# Patient Record
Sex: Female | Born: 1985
Health system: Southern US, Community
[De-identification: ages and names within clinical notes are randomized; demographics above are authoritative.]

## PROBLEM LIST (undated history)

## (undated) DIAGNOSIS — F329 Major depressive disorder, single episode, unspecified: Secondary | ICD-10-CM

## (undated) DIAGNOSIS — F32A Depression, unspecified: Secondary | ICD-10-CM

## (undated) DIAGNOSIS — J45909 Unspecified asthma, uncomplicated: Secondary | ICD-10-CM

## (undated) DIAGNOSIS — I10 Essential (primary) hypertension: Secondary | ICD-10-CM

## (undated) DIAGNOSIS — Z87828 Personal history of other (healed) physical injury and trauma: Secondary | ICD-10-CM

## (undated) DIAGNOSIS — Z8781 Personal history of (healed) traumatic fracture: Secondary | ICD-10-CM

## (undated) DIAGNOSIS — F259 Schizoaffective disorder, unspecified: Secondary | ICD-10-CM

## (undated) DIAGNOSIS — F419 Anxiety disorder, unspecified: Secondary | ICD-10-CM

## (undated) HISTORY — PX: SALPINGECTOMY: SHX328

## (undated) HISTORY — DX: Unspecified asthma, uncomplicated: J45.909

## (undated) HISTORY — DX: Anxiety disorder, unspecified: F41.9

## (undated) HISTORY — PX: WISDOM TOOTH EXTRACTION: SHX21

## (undated) HISTORY — PX: ADENOIDECTOMY: SUR15

---

## 2006-10-21 ENCOUNTER — Ambulatory Visit (HOSPITAL_COMMUNITY): Payer: Self-pay | Admitting: Psychiatry

## 2006-10-21 ENCOUNTER — Ambulatory Visit: Payer: Self-pay | Admitting: Family Medicine

## 2012-04-09 DIAGNOSIS — M545 Low back pain, unspecified: Secondary | ICD-10-CM | POA: Insufficient documentation

## 2014-02-06 ENCOUNTER — Encounter: Payer: Self-pay | Admitting: Emergency Medicine

## 2014-02-06 ENCOUNTER — Emergency Department (INDEPENDENT_AMBULATORY_CARE_PROVIDER_SITE_OTHER)
Admission: EM | Admit: 2014-02-06 | Discharge: 2014-02-06 | Disposition: A | Discharge status: Home / Self Care | Payer: Medicare Other | Source: Home / Self Care | Attending: Family Medicine | Admitting: Family Medicine

## 2014-02-06 DIAGNOSIS — J069 Acute upper respiratory infection, unspecified: Secondary | ICD-10-CM

## 2014-02-06 DIAGNOSIS — Z8781 Personal history of (healed) traumatic fracture: Secondary | ICD-10-CM | POA: Insufficient documentation

## 2014-02-06 DIAGNOSIS — F259 Schizoaffective disorder, unspecified: Secondary | ICD-10-CM | POA: Insufficient documentation

## 2014-02-06 DIAGNOSIS — I10 Essential (primary) hypertension: Secondary | ICD-10-CM | POA: Insufficient documentation

## 2014-02-06 DIAGNOSIS — Z87828 Personal history of other (healed) physical injury and trauma: Secondary | ICD-10-CM | POA: Insufficient documentation

## 2014-02-06 DIAGNOSIS — F329 Major depressive disorder, single episode, unspecified: Secondary | ICD-10-CM | POA: Insufficient documentation

## 2014-02-06 DIAGNOSIS — F32A Depression, unspecified: Secondary | ICD-10-CM | POA: Insufficient documentation

## 2014-02-06 HISTORY — DX: Personal history of (healed) traumatic fracture: Z87.81

## 2014-02-06 HISTORY — DX: Essential (primary) hypertension: I10

## 2014-02-06 HISTORY — DX: Major depressive disorder, single episode, unspecified: F32.9

## 2014-02-06 HISTORY — DX: Schizoaffective disorder, unspecified: F25.9

## 2014-02-06 HISTORY — DX: Personal history of other (healed) physical injury and trauma: Z87.828

## 2014-02-06 HISTORY — DX: Depression, unspecified: F32.A

## 2014-02-06 MED ORDER — SULFAMETHOXAZOLE-TMP DS 800-160 MG PO TABS: 1.0000 | ORAL_TABLET | Freq: Two times a day (BID) | ORAL | Status: DC

## 2014-02-06 MED ORDER — PREDNISONE 20 MG PO TABS: 20.0000 mg | ORAL_TABLET | Freq: Two times a day (BID) | ORAL | Status: DC

## 2014-02-06 MED ORDER — BENZONATATE 200 MG PO CAPS: 200.0000 mg | ORAL_CAPSULE | Freq: Every day | ORAL | Status: DC

## 2014-02-06 MED ORDER — ALBUTEROL SULFATE HFA 108 (90 BASE) MCG/ACT IN AERS: 2.0000 | INHALATION_SPRAY | Freq: Four times a day (QID) | RESPIRATORY_TRACT | Status: DC | PRN

## 2014-02-06 NOTE — Discharge Instructions (Signed)
Take plain Mucinex (1200 mg guaifenesin) twice daily for cough and congestion.  Increase fluid intake, rest. Try warm salt water gargles for sore throat.  Stop all antihistamines for now, and other non-prescription cough/cold preparations. Use Albuterol as needed Begin trimethoprim/sulfamethoxazole if not improving about 5 days or if persistent fever develops (Given a prescription to hold, with an expiration date)  Follow-up with family doctor if not improving 7 to 10 days.

## 2014-02-06 NOTE — ED Provider Notes (Signed)
CSN: [id number]     Arrival date & time 02/06/14  1503 History   First MD Initiated Contact with Patient 02/06/14 1537     Chief Complaint  Patient presents with  . Otalgia    x 2 days  . Sore Throat    x 1 week  . Headache    x 2 days      HPI Comments: Patient complains of sinus drainage for two weeks, but has not felt ill.  Two days ago she developed a mild sore throat, fatigue, earache, headache, and occasional cough. She has a history of mild asthma, and sinusitis.  The history is provided by the patient.    Past Medical History  Diagnosis Date  . Hypertension   . History of fracture of toe   . History of sprained ankle   . Depression   . Schizoaffective disorder    Past Surgical History  Procedure Laterality Date  . Adenoidectomy     Family History  Problem Relation Age of Onset  . Cancer Mother     Breast  . Depression Mother   . Anxiety disorder Mother   . Seizures Mother    History  Substance Use Topics  . Smoking status: Never Smoker   . Smokeless tobacco: Never Used  . Alcohol Use: No   OB History   Grav Para Term Preterm Abortions TAB SAB Ect Mult Living                 Review of Systems + sore throat + cough, minimal No pleuritic pain No wheezing + nasal congestion + post-nasal drainage No sinus pain/pressure No itchy/red eyes + earache No hemoptysis No SOB No fever/chills No nausea No vomiting No abdominal pain No diarrhea No urinary symptoms No skin rash + fatigue No myalgias + headache Used OTC meds without relief  Allergies  Codeine; Doxycycline; Latuda; Penicillins; Resperal-dm; and Topamax  Home Medications   Current Outpatient Rx  Name  Route  Sig  Dispense  Refill  . benztropine (COGENTIN) 1 MG tablet   Oral   Take 2 mg by mouth 2 (two) times daily.         . clonazePAM (KLONOPIN) 0.5 MG tablet   Oral   Take 0.25 mg by mouth daily.         Marland Kitchen. ibuprofen (ADVIL,MOTRIN) 800 MG tablet   Oral   Take 800 mg by  mouth every 8 (eight) hours as needed.         . medroxyPROGESTERone (PROVERA) 5 MG tablet   Oral   Take 5 mg by mouth daily.         . montelukast (SINGULAIR) 10 MG tablet   Oral   Take 10 mg by mouth at bedtime.         Marland Kitchen. omeprazole (PRILOSEC) 40 MG capsule   Oral   Take 40 mg by mouth daily.         . Oxcarbazepine (TRILEPTAL) 300 MG tablet   Oral   Take 600 mg by mouth 2 (two) times daily.         . propranolol (INDERAL) 10 MG tablet   Oral   Take 5 mg by mouth daily.         . sertraline (ZOLOFT) 100 MG tablet   Oral   Take 100 mg by mouth daily.         Marland Kitchen. albuterol (PROVENTIL HFA;VENTOLIN HFA) 108 (90 BASE) MCG/ACT inhaler   Inhalation  Inhale 2 puffs into the lungs every 6 (six) hours as needed for wheezing or shortness of breath.   1 Inhaler   0   . benzonatate (TESSALON) 200 MG capsule   Oral   Take 1 capsule (200 mg total) by mouth at bedtime. Take as needed for cough   12 capsule   0   . predniSONE (DELTASONE) 20 MG tablet   Oral   Take 1 tablet (20 mg total) by mouth 2 (two) times daily. Take with food.   10 tablet   0   . sulfamethoxazole-trimethoprim (BACTRIM DS) 800-160 MG per tablet   Oral   Take 1 tablet by mouth 2 (two) times daily. (Rx void after 02/14/14)   14 tablet   0    BP 143/93  Pulse 92  Temp(Src) 98.2 F (36.8 C) (Oral)  Ht 5\' 6"  (1.676 m)  Wt 313 lb (141.976 kg)  BMI 50.54 kg/m2  SpO2 96%  LMP 01/09/2014 Physical Exam Nursing notes and Vital Signs reviewed. Appearance:  Patient appears stated age, and in no acute distress.  Patient is obese (BMI 50.5) Eyes:  Pupils are equal, round, and reactive to light and accomodation.  Extraocular movement is intact.  Conjunctivae are not inflamed  Ears:  Canals normal.  Tympanic membranes normal.  Nose:  Mildly congested turbinates.  No sinus tenderness.   Pharynx:  Normal Neck:  Supple.  Tender enlarged posterior nodes are palpated bilaterally  Lungs:  Clear to  auscultation.  Breath sounds are equal.  Chest:  Distinct tenderness to palpation over the mid-sternum.  Heart:  Regular rate and rhythm without murmurs, rubs, or gallops.  Abdomen:  Nontender without masses or hepatosplenomegaly.  Bowel sounds are present.  No CVA or flank tenderness.  Extremities:  No edema.  No calf tenderness Skin:  No rash present.   ED Course  Procedures  none      MDM   1. Acute upper respiratory infections of unspecified site; suspect viral URI    There is no evidence of bacterial infection today.  Treat symptomatically for now  Begin prednisone burst.  Prescription written for Benzonatate (Tessalon) to take at bedtime for night-time cough.  Take plain Mucinex (1200 mg guaifenesin) twice daily for cough and congestion.  Increase fluid intake, rest. Try warm salt water gargles for sore throat.  Stop all antihistamines for now, and other non-prescription cough/cold preparations. Use Albuterol as needed Begin trimethoprim/sulfamethoxazole if not improving about 5 days or if persistent fever develops (Given a prescription to hold, with an expiration date)  Follow-up with family doctor if not improving 7 to 10 days.     Lattie Haw, MD 02/07/14 [phone]

## 2014-02-06 NOTE — ED Notes (Signed)
Montasia complains of sore throat for 1 week. She also reports dizziness, eye pain, ear pain, headaches, runny nose, sneezing and hoarseness for 2 days. Denies fever, chills or sweats.

## 2014-02-09 ENCOUNTER — Telehealth: Payer: Self-pay | Admitting: Emergency Medicine

## 2015-03-15 DIAGNOSIS — G4710 Hypersomnia, unspecified: Secondary | ICD-10-CM | POA: Insufficient documentation

## 2018-09-13 LAB — HM PAP SMEAR: HM Pap smear: NEGATIVE

## 2019-07-06 DIAGNOSIS — J45909 Unspecified asthma, uncomplicated: Secondary | ICD-10-CM | POA: Insufficient documentation

## 2019-07-06 DIAGNOSIS — M1990 Unspecified osteoarthritis, unspecified site: Secondary | ICD-10-CM | POA: Insufficient documentation

## 2019-10-17 ENCOUNTER — Other Ambulatory Visit: Payer: Self-pay

## 2019-10-17 ENCOUNTER — Ambulatory Visit (INDEPENDENT_AMBULATORY_CARE_PROVIDER_SITE_OTHER): Payer: Medicaid Other | Admitting: Licensed Clinical Social Worker

## 2019-10-17 DIAGNOSIS — F331 Major depressive disorder, recurrent, moderate: Secondary | ICD-10-CM
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
## 2019-10-31 ENCOUNTER — Encounter (HOSPITAL_COMMUNITY): Payer: Self-pay | Admitting: Psychiatry

## 2019-10-31 ENCOUNTER — Ambulatory Visit (INDEPENDENT_AMBULATORY_CARE_PROVIDER_SITE_OTHER): Payer: Medicaid Other | Admitting: Psychiatry

## 2019-10-31 DIAGNOSIS — F5102 Adjustment insomnia: Secondary | ICD-10-CM | POA: Diagnosis not present

## 2019-10-31 DIAGNOSIS — F251 Schizoaffective disorder, depressive type: Secondary | ICD-10-CM | POA: Diagnosis not present

## 2019-10-31 DIAGNOSIS — G4700 Insomnia, unspecified: Secondary | ICD-10-CM | POA: Insufficient documentation

## 2019-10-31 DIAGNOSIS — F411 Generalized anxiety disorder: Secondary | ICD-10-CM | POA: Diagnosis not present

## 2019-10-31 DIAGNOSIS — J309 Allergic rhinitis, unspecified: Secondary | ICD-10-CM | POA: Insufficient documentation

## 2019-10-31 MED ORDER — PERPHENAZINE 4 MG PO TABS: 4.0000 mg | ORAL_TABLET | Freq: Two times a day (BID) | ORAL | 1 refills | Status: DC

## 2019-10-31 MED ORDER — VENLAFAXINE HCL ER 37.5 MG PO CP24: 112.5000 mg | ORAL_CAPSULE | Freq: Every day | ORAL | 1 refills | Status: DC

## 2019-10-31 MED ORDER — BENZTROPINE MESYLATE 0.5 MG PO TABS: 0.5000 mg | ORAL_TABLET | Freq: Two times a day (BID) | ORAL | 1 refills | Status: DC

## 2019-10-31 MED ORDER — OXCARBAZEPINE 300 MG PO TABS: 600.0000 mg | ORAL_TABLET | Freq: Two times a day (BID) | ORAL | 1 refills | Status: DC
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
## 2019-11-23 ENCOUNTER — Ambulatory Visit (INDEPENDENT_AMBULATORY_CARE_PROVIDER_SITE_OTHER): Payer: Medicare Other | Admitting: Licensed Clinical Social Worker

## 2019-11-23 DIAGNOSIS — F251 Schizoaffective disorder, depressive type: Secondary | ICD-10-CM

## 2019-11-23 DIAGNOSIS — F411 Generalized anxiety disorder: Secondary | ICD-10-CM | POA: Diagnosis not present

## 2019-11-23 DIAGNOSIS — F5102 Adjustment insomnia: Secondary | ICD-10-CM

## 2019-11-23 NOTE — Progress Notes (Signed)
Virtual Visit via Video Note  I connected with Shannon Boyd on 11/23/19 at 10:00 AM EST by a video enabled telemedicine application and verified that I am speaking with the correct person using two identifiers.   I discussed the limitations of evaluation and management by telemedicine and the availability of in person appointments. The patient expressed understanding and agreed to proceed.  I discussed the assessment and treatment plan with the patient. The patient was provided an opportunity to ask questions and all were answered. The patient agreed with the plan and demonstrated an understanding of the instructions.   The patient was advised to call back or seek an in-person evaluation if the symptoms worsen or if the condition fails to improve as anticipated.  I provided 55 minutes of non-face-to-face time during this encounter.  THERAPIST PROGRESS NOTE  Session Time: 11:02 PM to 11:57 PM  Participation Level: Active  Behavioral Response: CasualAlertDepressed-appropriate in session  Type of Therapy: Individual Therapy  Treatment Goals addressed: maintain stability with mood, learn and apply effective strategies to manage mood, coping  Interventions: CBT, Solution Focused, Strength-based, Supportive, Reframing and Other: self-esteem, coping  Summary: Shannon Boyd is a 33 y.o. female who presents with symptoms of depression and sadness and "I have gotten through it so far" Trying to find someone to have a relationship and haven't found anybody yet. Describes that she feels lonely, has been trying online dating  Did before gave up and tried more recently. Discussed the struggles of online dating with patient. Therapist reviewed with patient realities of online dating but positive feedback for her taking action steps and putting in effort to meet somebody. Reviewed other options where she could meet somebody such as engaging in activities she enjoys. Patient shares there is a singles group  for church.  Now going to [employer] and [employer]. Thinks it should be over soon  Discussed feeling lonely attends church things and being with mom. Those things are not working and feels more isolated. Reviewed work situation and how that can help with feeling isolated and lonely.  Has disability but trying to work. 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 Stopped working at [employer]. Upset with administration because didn't hire anyone to help and felt overwhelmed. Open to another job. Looking at different jobs looking at part-time because helps take care of mom. Mom has neurological issues so not easily explain her medical issues. Reviewed conflict with mom and patient shares mostly getting stuff done and who is going to do it. Symptoms experiencing is depression but is working through with coping skills taught. Reviewed her coping skills. Reviewed challenging negative thoughts. Patient explains one a thought she has is that she is a failure. Relates even though has that thought she realizes she graduated from college and accomplishing things but "right now don't feel like I have anything to show for it". Reviewed helps to challenge negative thought with tattoo with semi colon which form the body and head of butterfly and wings come out from it. Relates it reminds her of Darrick Meigs message that she was saved and that she is worth saving, helps to remind her and helps a lot. Physical reminders help. Has bracelets such as "this shall pass" and this patient knows to be the truth. A semi-colon is a symbol of survived suicide and applicable to patient. Reviewed in general that spirituality helps with coping. Reviewed strategies for self-esteem engaging in activities  and having a sense of being able to do something, leads to feeling of accomplishment. Reviewed session and patient relates it is a reinforcer of doing the right thing to  reach out, be cautious. Struggle with self-esteem but still have affirmations when start feeling that way Suicidal/Homicidal: No  Therapist Response: Reviewed symptoms, discussed stressors, facilitated expression of thoughts and feelings.  Completed treatment plan and patient gave verbal consent to complete virtually.  Processed patient's feelings to help cope with starting to date online, offering more realistic expectations as well as needing patient's and positive coping that she is taking a step to meet people because she feels lonely.  Reviewed other ways to meet people.  Reviewed coping strategies in general she uses for depression.  Talked about automatic thoughts that lead to depression that are distorted and helps to challenge for more accurate perspective.  Identified patient's thought that "I am a failure" worked on ways to challenge that thought pointing out how she is shared with therapist ability to cope expressing capability and competence at this believe will also help her cope with stress more effectively.  Reviewed accomplishment of graduating from college, provided education that self-esteem involves recognizing that we will have conditional value and we could do things that help [place] appreciate her value.  Encourage patient engaging in activity, some type of hobby, self-esteem comes with being able to do something that leads to feelings of accomplishment.  Reviewed more realistic expectation for career that for some people takes time and also other strategy of positive affirmations helps and changing neuro pathways to help 1 thing more positively.  Will explore in more depth this topic as well as working on strategies to build self-esteem in general and manage mood.  Provided strength based and supportive intervention.Shannon Boyd**  Plan: Return again in 3-4 weeks.2.Work with patient on management of stressors and strategies to manage mood.3.Work with patient on self-esteem strategies such as  Secretary/administrator.  Diagnosis: Axis I:  schizoaffective disorder, depressive type, generalized anxiety disorder, adjustment insomnia    Axis II: No diagnosis    Coolidge Breeze, LCSW 11/23/2019

## 2019-12-22 ENCOUNTER — Ambulatory Visit (INDEPENDENT_AMBULATORY_CARE_PROVIDER_SITE_OTHER): Payer: Medicare Other | Admitting: Licensed Clinical Social Worker

## 2019-12-22 ENCOUNTER — Other Ambulatory Visit: Payer: Self-pay

## 2019-12-22 DIAGNOSIS — F251 Schizoaffective disorder, depressive type: Secondary | ICD-10-CM | POA: Diagnosis not present

## 2019-12-22 DIAGNOSIS — F5102 Adjustment insomnia: Secondary | ICD-10-CM | POA: Diagnosis not present

## 2019-12-22 DIAGNOSIS — F411 Generalized anxiety disorder: Secondary | ICD-10-CM

## 2019-12-22 NOTE — Progress Notes (Signed)
Virtual Visit via Video Note  I connected with Shannon Boyd on 12/22/19 at 10:00 AM EST by a video enabled telemedicine application and verified that I am speaking with the correct person using two identifiers.   I discussed the limitations of evaluation and management by telemedicine and the availability of in person appointments. The patient expressed understanding and agreed to proceed.    I discussed the assessment and treatment plan with the patient. The patient was provided an opportunity to ask questions and all were answered. The patient agreed with the plan and demonstrated an understanding of the instructions.   The patient was advised to call back or seek an in-person evaluation if the symptoms worsen or if the condition fails to improve as anticipated.  I provided 53 minutes of non-face-to-face time during this encounter.  THERAPIST PROGRESS NOTE  Session Time: 10:03 AM to 10:56 AM  Participation Level: Active  Behavioral Response: CasualAlertDysphoric and appropriate in session  Type of Therapy: Individual Therapy  Treatment Goals addressed:  maintain stability with mood, learn and apply effective strategies to manage mood, coping  Interventions: CBT, Solution Focused, Strength-based, Supportive and Other: coping  Summary: Shannon Boyd is a 34 y.o. female who presents with alright. At night get sad occassionally 1-2 a week and prays until get to sleep. Feeling lonely. Still involved with [employer] may be finished in March and maybe can join singles [place] group. Applied to a senior office position at [employer].  We will continue to look at work. Has her sponsor who sees twice a week and that is a good support, I find that we are supporting each other she has difficulties and she guides me with my difficulties. During the week stay in contact with people that are like minded. stay in contact with Christians twice a week. She has  a prayer journal, does an  inventory each night as well as prayer and reading the Bible.  Shares mentality of celebrate recovery this to stay connected the more connected the more you are closer to God. Sponsor is one of her Darrick Meigs contacts, it is very helpful for her to stay connected with Government social research officer.  Therapist reviewed mood regulation strategies helped to help shift mood patient shares making herself walk or exercise in early afternoon because trying to lose weight. Do four days a week. Often has to make herself.    Reviewed patient's self talk and relates she used to have negative self talk but very helpful her spirituality reminding herself that God loves her unconditionally and that people must stop and are limited.  Relates never had a father figure so having a living father figure very helpful.  She relates not as positive in relation to what she thinks about herself, positive about what God says holds onto that daily. Needs to work on her own self talk with herself.  Reviewed that positive self talk helps with coping with stressors more effectively. Reviewed patient's values and she shared she thinks her values are to do what is ethical and right.was provided more education how she can take pick different things that are important to her and put energy into that.   Reviewed session and patient relates will look up meeting [email or web address] that therapist had suggested while thinking of different ways she can connect to people.  Suicidal/Homicidal: No  Therapist Response: Therapist reviewed symptoms, discussed stressors, facilitated expression of thoughts and feelings.  Identified loneliness that continues to be source of depression and explored different types  of groups for patient can become connected with other such as meet [email or web address] where she can join a group with people with similar interests and develop the relationship.  Brainstormed about other options she can explore.  Encourage patient in keeping active schedule helps  with depression, activities that are positively reinforcing that help with mood and identified patient has different types of activities that help her to do this.  Reviewed value of exercise that it provides instant relief for depression, provided positive feedback for patient's commitment to do this regularly and that it also helps with self-esteem and health.Reviewed worksheet on self-esteem to help patient continue to strengthen relationship with herself, reviewed this will have a positive effect on her relationships.  Reviewed keeping promises to oneself helps 1 to feel good about oneself.  Using gentle self talk helpful encourage patient to mind herself to be gentle to herself to help her was positive self talk.  Reviewed values clarification helpful for mood, reviewed patient's values and explained the more energy she puts in 2 but is important to her will help her with mood.  Discussed depression and explained people who are depressed tend to think very negatively about themselves the future in the world around them.  It can be like seeing life through gloomy specks.  Encourage patient to remember this that her perspective is distorted, and to ask herself questions to challenge distortions to see things more accurately and helpfully.  Provided strength based on supportive intervention.   Plan: Return again in 4 weeks.2..Work with patient on management of stressors and strategies to manage mood.3.Work with patient on self-esteem, strategies to move forward with issues patient is working on such as developing a relationship  Diagnosis: Axis I:  schizoaffective disorder, depressive type, generalized anxiety disorder, adjustment insomnia    Axis II: No diagnosis    Coolidge Breeze, LCSW 12/22/2019

## 2019-12-29 ENCOUNTER — Ambulatory Visit (INDEPENDENT_AMBULATORY_CARE_PROVIDER_SITE_OTHER): Payer: Medicare Other | Admitting: Psychiatry

## 2019-12-29 ENCOUNTER — Encounter (HOSPITAL_COMMUNITY): Payer: Self-pay | Admitting: Psychiatry

## 2019-12-29 DIAGNOSIS — F411 Generalized anxiety disorder: Secondary | ICD-10-CM | POA: Diagnosis not present

## 2019-12-29 DIAGNOSIS — F5102 Adjustment insomnia: Secondary | ICD-10-CM

## 2019-12-29 DIAGNOSIS — F251 Schizoaffective disorder, depressive type: Secondary | ICD-10-CM | POA: Diagnosis not present

## 2019-12-29 MED ORDER — PERPHENAZINE 4 MG PO TABS: 4.0000 mg | ORAL_TABLET | Freq: Two times a day (BID) | ORAL | 1 refills | Status: DC

## 2019-12-29 MED ORDER — VENLAFAXINE HCL ER 37.5 MG PO CP24: 112.5000 mg | ORAL_CAPSULE | Freq: Every day | ORAL | 1 refills | Status: DC

## 2019-12-29 MED ORDER — BENZTROPINE MESYLATE 0.5 MG PO TABS: 0.5000 mg | ORAL_TABLET | Freq: Two times a day (BID) | ORAL | 1 refills | Status: DC

## 2019-12-29 MED ORDER — OXCARBAZEPINE 300 MG PO TABS: 600.0000 mg | ORAL_TABLET | Freq: Two times a day (BID) | ORAL | 1 refills | Status: DC

## 2019-12-29 NOTE — Progress Notes (Signed)
Psychiatric Initial Adult Assessment   Patient Identification: Shannon Boyd MRN:  [id number] Date of Evaluation:  12/29/2019 Referral Source: primary care. Also by our Therapist [facility] Chief Complaint:    Visit Diagnosis:    ICD-10-CM   1. Schizoaffective disorder, depressive type ([place])  F25.1   2. Adjustment insomnia  F51.02   3. GAD (generalized anxiety disorder)  F41.1   I connected with Schelly Norgaard on 12/29/19 at  4:00 PM EST by a video enabled telemedicine application and verified that I am speaking with the correct person using two identifiers.     I discussed the limitations of evaluation and management by telemedicine and the availability of in person appointments. The patient expressed understanding and agreed to proceed.  History of Present Illness: Patient is a 34 years old currently single Caucasian female is living with her mom she is currently on disability for schizophrenia, obesity and other diagnoses.  She wanted to move her services from [facility] as she is not having regular appointments or difficulty at times feeling of medications  Brief history " She has depression episodes of withdrawn apathy decreased energy, withdrawn, hopeless with numerous suicidal attempts or ideations leading to hospitalizations. Last was in 2017 '  She continues to do therapy  She does get sad and lonely, dwells on worries meds help but discussed if effexor can be increased  She is attending a support group for depression that helps  Feels low self esteem at times otherwise does her part to help kids and home  Had tactile hallucinatins but did not elaborate today or concern   Aggravating factor: difficult childhood and dealing with mom. lonliness  Modifying factor: housing Duration since young age Drug use : denies  Past psych meds: Latuda, risperdal. abilify . Had side effects   Past Psychiatric History: schizoaffective disorder  Previous Psychotropic Medications: Yes    Substance Abuse History in the last 12 months:  No.  Consequences of Substance Abuse: NA  Past Medical History:  Past Medical History:  Diagnosis Date  . Depression   . History of fracture of toe   . History of sprained ankle   . Hypertension   . Schizoaffective disorder [facility])     Past Surgical History:  Procedure Laterality Date  . ADENOIDECTOMY      Family Psychiatric History: Mom told patient . Her dad had schizophrenia  Family History:  Family History  Problem Relation Age of Onset  . Cancer Mother        Breast  . Depression Mother   . Anxiety disorder Mother   . Seizures Mother     Social History:   Social History   Socioeconomic History  . Marital status: Single    Spouse name: Not on file  . Number of children: Not on file  . Years of education: Not on file  . Highest education level: Not on file  Occupational History  . Not on file  Tobacco Use  . Smoking status: Never Smoker  . Smokeless tobacco: Never Used  Substance and Sexual Activity  . Alcohol use: No  . Drug use: No  . Sexual activity: Not on file  Other Topics Concern  . Not on file  Social History Narrative  . Not on file   Social Determinants of Health   Financial Resource Strain:   . Difficulty of Paying Living Expenses: Not on file  Food Insecurity:   . Worried About Charity fundraiser in the Last Year: Not on file  .  Ran Out of Food in the Last Year: Not on file  Transportation Needs:   . Lack of Transportation (Medical): Not on file  . Lack of Transportation (Non-Medical): Not on file  Physical Activity:   . Days of Exercise per Week: Not on file  . Minutes of Exercise per Session: Not on file  Stress:   . Feeling of Stress : Not on file  Social Connections:   . Frequency of Communication with Friends and Family: Not on file  . Frequency of Social Gatherings with Friends and Family: Not on file  . Attends Religious Services: Not on file  . Active Member of Clubs or  Organizations: Not on file  . Attends Banker Meetings: Not on file  . Marital Status: Not on file       Allergies:   Allergies  Allergen Reactions  . Codeine   . Doxycycline   . Latuda [Lurasidone Hcl]   . Penicillins   . Resperal-Dm [Pseudoeph-Bromphen-Dm]   . Topamax [Topiramate]     Metabolic Disorder Labs: No results found for: HGBA1C, MPG No results found for: PROLACTIN No results found for: CHOL, TRIG, HDL, CHOLHDL, VLDL, LDLCALC No results found for: TSH  Therapeutic Level Labs: No results found for: LITHIUM No results found for: CBMZ No results found for: VALPROATE  Current Medications: Current Outpatient Medications  Medication Sig Dispense Refill  . albuterol (PROVENTIL HFA;VENTOLIN HFA) 108 (90 BASE) MCG/ACT inhaler Inhale 2 puffs into the lungs every 6 (six) hours as needed for wheezing or shortness of breath. 1 Inhaler 0  . benzonatate (TESSALON) 200 MG capsule Take 1 capsule (200 mg total) by mouth at bedtime. Take as needed for cough 12 capsule 0  . benztropine (COGENTIN) 0.5 MG tablet Take 1 tablet (0.5 mg total) by mouth 2 (two) times daily. 60 tablet 1  . ibuprofen (ADVIL,MOTRIN) 800 MG tablet Take 800 mg by mouth every 8 (eight) hours as needed.    . medroxyPROGESTERone (PROVERA) 5 MG tablet Take 5 mg by mouth daily.    . montelukast (SINGULAIR) 10 MG tablet Take 10 mg by mouth at bedtime.    Marland Kitchen omeprazole (PRILOSEC) 40 MG capsule Take 40 mg by mouth daily.    . Oxcarbazepine (TRILEPTAL) 300 MG tablet Take 2 tablets (600 mg total) by mouth 2 (two) times daily. 60 tablet 1  . perphenazine (TRILAFON) 4 MG tablet Take 1 tablet (4 mg total) by mouth 2 (two) times daily. 60 tablet 1  . predniSONE (DELTASONE) 20 MG tablet Take 1 tablet (20 mg total) by mouth 2 (two) times daily. Take with food. 10 tablet 0  . sulfamethoxazole-trimethoprim (BACTRIM DS) 800-160 MG per tablet Take 1 tablet by mouth 2 (two) times daily. (Rx void after 02/14/14) 14  tablet 0  . venlafaxine XR (EFFEXOR-XR) 37.5 MG 24 hr capsule Take 3 capsules (112.5 mg total) by mouth daily with breakfast. 90 capsule 1   No current facility-administered medications for this visit.       Psychiatric Specialty Exam: Review of Systems  Cardiovascular: Negative for chest pain.  Skin: Negative for rash.  Psychiatric/Behavioral: Negative for substance abuse and suicidal ideas.    There were no vitals taken for this visit.There is no height or weight on file to calculate BMI.  General Appearance: Casual  Eye Contact:  Fair  Speech:  Normal Rate  Volume:  Decreased  Mood: somewhat subdued  Affect:  Constricted  Thought Process:  Goal Directed  Orientation:  Full (  Time, Place, and Person)  Thought Content:  Rumination  Suicidal Thoughts:  No  Homicidal Thoughts:  No  Memory:  Immediate;   Fair Recent;   Fair  Judgement:  Fair  Insight:  Shallow  Psychomotor Activity:  Normal  Concentration:  Concentration: Fair and Attention Span: Fair  Recall:  [employer] of Knowledge:Fair  Language: Fair  Akathisia:  No  Handed:  Right  AIMS (if indicated):  No involuntary movements  Assets:  Desire for Improvement Financial Resources/Insurance Leisure Time Physical Health  ADL's:  Intact  Cognition: WNL  Sleep:  Fair   Screenings:   Assessment and Plan: as follows Schizoaffective disorder, depressed type: somewhat subdued wants to think of increasing effexor and will call in 2 weeks otherwise continue therapy and current dose for now Continue trileptal, perphenazaine . No tremors Continue cogentin   GAD: fluctuates, working on coping skills. Continue therapy  Past history of OD on benzo, will avoid and she is working on Pharmacologist and attends an Emotionally annonymous group and has sponsor to call Insomnia; not worse. Continue sleep hygiene  I discussed the assessment and treatment plan with the patient. The patient was provided an opportunity to ask  questions and all were answered. The patient agreed with the plan and demonstrated an understanding of the instructions.   The patient was advised to call back or seek an in-person evaluation if the symptoms worsen or if the condition fails to improve as anticipated.  Fu 108m. meds refilled   Thresa Ross, MD 1/28/20214:12 PM

## 2020-01-19 ENCOUNTER — Ambulatory Visit (INDEPENDENT_AMBULATORY_CARE_PROVIDER_SITE_OTHER): Payer: Medicare Other | Admitting: Licensed Clinical Social Worker

## 2020-01-19 DIAGNOSIS — F411 Generalized anxiety disorder: Secondary | ICD-10-CM

## 2020-01-19 DIAGNOSIS — F251 Schizoaffective disorder, depressive type: Secondary | ICD-10-CM | POA: Diagnosis not present

## 2020-01-19 NOTE — Progress Notes (Signed)
Virtual Visit via Video Note  I connected with Shannon Boyd on 01/19/20 at 11:00 AM EST by a video enabled telemedicine application and verified that I am speaking with the correct person using two identifiers.   I discussed the limitations of evaluation and management by telemedicine and the availability of in person appointments. The patient expressed understanding and agreed to proceed.  I discussed the assessment and treatment plan with the patient. The patient was provided an opportunity to ask questions and all were answered. The patient agreed with the plan and demonstrated an understanding of the instructions.   The patient was advised to call back or seek an in-person evaluation if the symptoms worsen or if the condition fails to improve as anticipated.  I provided 45 minutes of non-face-to-face time during this encounter.  THERAPIST PROGRESS NOTE  Session Time: 11:00 AM to 11:45 AM  Participation Level: Active  Behavioral Response: CasualAlertDepressed  Type of Therapy: Individual Therapy  Treatment Goals addressed: maintain stability with mood, learn and apply effective strategies to manage mood, coping  Interventions: Solution Focused, Strength-based, Supportive, Reframing and Other: coping  Summary: Shannon Boyd is a 34 y.o. female who presents with overall the same. Related though Valentine's Day main trigger and people prayed for her. Ttried to commit suicide in past on that day. The whole month of February is hard, the day "went well" and so far doing pretty good. Did look into Meet Up and she is not available at times for groups she is interested in. Not interested in [place] classes, bad experience. Working on  fine arts degree, dropped out because of transportation and teachers not helpflu. Not happy the way handled it. Not the support there. Depression related to being lonely. Was on [employer] dating app, giving up on apps on this one guys are not talking  put off by them.  Therapist encourage patient to network with friends they mean of groups she would be interested in also look at [place].  Discussed elf-talk that is reassuring as helpful to deal with trigger of being lonely. Interacting with [facility] helps. For example somebody did not meet someone 40's. Patient thinking maybe not time to be in relationship for one reason because of living situation. Also thinks it may be the time to focus on other things like her health. Health and losing weight. Have been doing this several weeks. Doing well working with dietician and other through [employer]. Lost 22 lbs. Three goals every time sees dietician. . Mostly walk, try to do other exercises when weather bad, eating more protein and keep track of it and carbs..Walks 20 minutes or more.  Explored other ways to work on herself does not frequently but encouraged to keep keep doing stamping and crafting thing. Makes cards. Reads at night read the Bible. Journal of things that happen during the day. Reflects if somebody wronged her if she wronged anybody right now. Keeps track of health, but  can be anything. Emotional stuff. Big concern to get through the month without suicide attempts and thoughts of hurting herself and so far doing ok. Remember the God she believes wants her here and supposed to be here. Really helpful. Talked about it with group very helpful.  Patient reviewed session and says learned to look at things with perspective, not sure how to do this, sometimes look back and see.  Also focus on health and not on relationship, focus on what she needs to work on for herself. Marland Kitchen  Suicidal/Homicidal: No  Therapist Response: Therapist reviewed symptoms, discussed stressors, facilitated expression of thoughts and feelings.  Identify main trigger for depression continues to be feeling lonely, that February and Valentine's Day's are significant triggers for her.  Provided positive feedback for effective  coping and how her Christian network and spirituality helps with her coping.  Therapist also discussed taking broader perspective, developing narrative that is richer and deeper that comes from perspective in terms of her experiences of struggling now with being lonely and single as well as developing over time with more distance, more experience, digging deeper to find deeper analysis where insight and growth occur about our experience.  Worked on healthy strategy for patient to focus on herself, her health this will help her with her growth, feeling good about herself though she is benefiting from this time of being single.  Reviewed supportive self talk that helps her with coping with being single such as having faith, seeing other people who have met somebody when older.  Reviewed other ways for her to network such as talking to friends and clubs they have joined.  Discussed engaging in creative process is helpful for mood and reinforced patient's interest in different activities such as crafting and stamping.  Provided strength based and supportive intervention.  Plan: Return again in 4 weeks.2.  Work with patient on depression, triggers of being lonely, self-esteem  Diagnosis: Axis I:  schizoaffective disorder, depressive type, generalized anxiety disorder, adjustment insomnia    Axis II: No diagnosis    Cordella Register, LCSW 01/19/2020

## 2020-01-26 ENCOUNTER — Telehealth (HOSPITAL_COMMUNITY): Payer: Self-pay

## 2020-01-26 MED ORDER — PERPHENAZINE 4 MG PO TABS: 4.0000 mg | ORAL_TABLET | Freq: Two times a day (BID) | ORAL | 1 refills | Status: DC

## 2020-01-26 NOTE — Telephone Encounter (Signed)
That's fine . She was a new patient already had meds before, I did not change. Please confirm with pharmacy prior dose and send

## 2020-01-26 NOTE — Telephone Encounter (Signed)
Resent medication to pharmacy per Dr. Gilmore Laroche. Informed patient

## 2020-01-26 NOTE — Telephone Encounter (Signed)
Patient states that she is taking Perphenazine 4mg  1 in the morning and 2 at night but the current rx that was sent states take one 2 times daily. She states she wants the previous rx because she can not cope with taking only one at night. Please advise.

## 2020-02-16 ENCOUNTER — Ambulatory Visit (INDEPENDENT_AMBULATORY_CARE_PROVIDER_SITE_OTHER): Payer: Medicare Other | Admitting: Licensed Clinical Social Worker

## 2020-02-16 DIAGNOSIS — F5102 Adjustment insomnia: Secondary | ICD-10-CM | POA: Diagnosis not present

## 2020-02-16 DIAGNOSIS — F411 Generalized anxiety disorder: Secondary | ICD-10-CM | POA: Diagnosis not present

## 2020-02-16 DIAGNOSIS — F251 Schizoaffective disorder, depressive type: Secondary | ICD-10-CM | POA: Diagnosis not present

## 2020-02-16 NOTE — Progress Notes (Signed)
Virtual Visit via Video Note  I connected with Shannon Boyd on 02/16/20 at 10:00 AM EDT by a video enabled telemedicine application and verified that I am speaking with the correct person using two identifiers.   I discussed the limitations of evaluation and management by telemedicine and the availability of in person appointments. The patient expressed understanding and agreed to proceed.   I discussed the assessment and treatment plan with the patient. The patient was provided an opportunity to ask questions and all were answered. The patient agreed with the plan and demonstrated an understanding of the instructions.   The patient was advised to call back or seek an in-person evaluation if the symptoms worsen or if the condition fails to improve as anticipated.  I provided 53 minutes of non-face-to-face time during this encounter.   THERAPIST PROGRESS NOTE  Session Time: 10:00 AM to 10:53 AM  Participation Level: Active  Behavioral Response: CasualAlertEuthymic  Type of Therapy: Individual Therapy  Treatment Goals addressed:  maintain stability with mood, learn and apply effective strategies to manage mood, coping  Interventions: CBT, Solution Focused, Strength-based, Supportive and Other: coping  Summary: Shannon Boyd is a 34 y.o. female who presents with sharing with therapist that she is "hopeful". Had an interview and thinks did well and will have to see. Interview at [facility] particularly exciting with art background. Liberal arts background. Discussed helpful plan of patient focusing on herself and making progress. Lost 25 lbs. Should be down with BMI in upcoming week so excited about that as well. Notices when losing weight that different size and that was exciting Tries to stay busy, glad that job that she is interviewing for is part-time so can run errors for mom. For the first time in a long time mom  walked with patient. They had a good conversation about what she was reading  and mom said she felt better after walking.  Discussed maybe this would lead to more walking and helping the relationship. Reviewed what keeps patient busy says when reading she focuses on spiritual things. Me[employer] C.S Melvyn Neth is something she recently read. Yesterday finished 12-step class.  Therapist pointed out her impression that patient shows she accomplishes things she sets out to do patient says she needs a goal to work toward. Shared with therapist more about 12-step program. Once recognize something you need to work on and go through 12-step of what you need to work on. Go through booklets either things in past you need to work on and things to think about as you progress. Not sure how to help doing 2-step work which is Automotive engineer work. Hope CR leader will help guide her with that. It is about service and teach others about how she learned. Reviewed what patient worked on in her own 12-step work and she shares that understand that God is in control of things and don't have control over a lot of things. Thinking about being in control worry unnecessarily and don't do that now in most areas of her life. Worry about it wasn't going to make it better. Let go and let God. The only thing that we can control is how we react. Patient shared that there is more to Serenity prayer than therapist realized. Discussed how insightful that part is as well. The prayer states accepting hardship as a pathway to peace. Taking this sinful world as is not as I would have it. Ephriam Knuckles world understand it is broken so not easy. Discussed that the  singles group more aimed at single parents. End up decided not to do it and there is plenty for patient to work on about herself. Discussed any other areas to grow in. 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 Struggling. On step 3 turn over to God. Supposed to pray when have cravings. Can use it for any hang up and  habit.  Begin review of work she "coping and depression" therapist pointing out how we talked about engaging in different activities and social connection and connecting this to help decrease depression       Suicidal/Homicidal: No  Therapist Response: Therapist reviewed symptoms, assessed patient continues to make progress in maintaining stability in mood by her own proactive steps to work on herself, utilization of 12-step programming to work on herself to promote growth, work on issues.  Therapist utilized strength based intervention and talking about her impression of patient making progress on goals she set for herself as a sign of strength for patient also noted accomplishing things help with self-esteem.  Reviewed her particular goal of losing weight helpful for self-esteem help in terms of how she feels and how she looks.  Therapist utilized education point in discussing patient's accomplishments by relating goals are reached daily by putting in the time to work on goals and seeing patient do that.  Reviewed stressor of relationship with mom and how walking may help with that relationship.  Begin worksheet "coping skills depression" to help patient learn and acquire new skills to manage depression.  Reviewed patient's involvement in celebrate recovery and spirituality guide her in broad understandings of life that are very helpful for her coping such as recognizing a lot of things are not under control, recognition of hardships in life but and attitude that lead to peace that also helps with coping.  Access patient investing into areas of her life such as looking for a job that is helping with mood and maintaining stability  Plan: Return again in 5 weeks.2.  Therapist continue to support patient with positive coping strategies and therapist educate patient for additional coping strategies to maintain stability with mood Diagnosis: Axis I:   schizoaffective disorder, depressive type, generalized  anxiety disorder, adjustment insomnia   Axis II: No diagnosis    Cordella Register, LCSW 02/16/2020

## 2020-02-27 ENCOUNTER — Ambulatory Visit (INDEPENDENT_AMBULATORY_CARE_PROVIDER_SITE_OTHER): Payer: Medicare Other | Admitting: Psychiatry

## 2020-02-27 ENCOUNTER — Encounter (HOSPITAL_COMMUNITY): Payer: Self-pay | Admitting: Psychiatry

## 2020-02-27 DIAGNOSIS — F411 Generalized anxiety disorder: Secondary | ICD-10-CM

## 2020-02-27 DIAGNOSIS — F251 Schizoaffective disorder, depressive type: Secondary | ICD-10-CM | POA: Diagnosis not present

## 2020-02-27 DIAGNOSIS — F5102 Adjustment insomnia: Secondary | ICD-10-CM

## 2020-02-27 NOTE — Progress Notes (Signed)
[employer] Follow up visit  Patient Identification: Shannon Boyd MRN:  [id number] Date of Evaluation:  02/27/2020 Referral Source: primary care. Also by our Therapist [facility] Chief Complaint:   follow up med review Visit Diagnosis:    ICD-10-CM   1. Schizoaffective disorder, depressive type ([place])  F25.1   2. GAD (generalized anxiety disorder)  F41.1   3. Adjustment insomnia  F51.02      I connected with Shannon Boyd on 02/27/20 at  3:30 PM EDT by a video enabled telemedicine application and verified that I am speaking with the correct person using two identifiers.  I discussed the limitations of evaluation and management by telemedicine and the availability of in person appointments. The patient expressed understanding and agreed to proceed.  History of Present Illness: Patient is a 34 years old currently single Caucasian female is living with her mom she is currently on disability for schizophrenia, obesity and other diagnoses.  She wanted to move her services from [facility] as she is not having regular appointments or difficulty at times feeling of medications  Brief history " She has depression episodes of withdrawn apathy decreased energy, withdrawn, hopeless with numerous suicidal attempts or ideations leading to hospitalizations. Last was in 2017 '  Doing fair on meds, did not feel effexor need to be increased Feels perphenazine 3 a day works good and takes cogentin for side effects Denies any worsening of side effects She is attending a support group for depression that helps and therapy  Did not endorse hallucinations  Aggravating factor: difficult childhood and dealing with mom. lonliness  Modifying factor: housing Duration since young age Drug use : denies  Past psych meds: Latuda, risperdal. abilify . Had side effects   Past Psychiatric History: schizoaffective disorder  Previous Psychotropic Medications: Yes   Substance Abuse History in the last 12 months:   No.  Consequences of Substance Abuse: NA  Past Medical History:  Past Medical History:  Diagnosis Date  . Depression   . History of fracture of toe   . History of sprained ankle   . Hypertension   . Schizoaffective disorder [facility])     Past Surgical History:  Procedure Laterality Date  . ADENOIDECTOMY      Family Psychiatric History: Mom told patient . Her dad had schizophrenia  Family History:  Family History  Problem Relation Age of Onset  . Cancer Mother        Breast  . Depression Mother   . Anxiety disorder Mother   . Seizures Mother     Social History:   Social History   Socioeconomic History  . Marital status: Single    Spouse name: Not on file  . Number of children: Not on file  . Years of education: Not on file  . Highest education level: Not on file  Occupational History  . Not on file  Tobacco Use  . Smoking status: Never Smoker  . Smokeless tobacco: Never Used  Substance and Sexual Activity  . Alcohol use: No  . Drug use: No  . Sexual activity: Not on file  Other Topics Concern  . Not on file  Social History Narrative  . Not on file   Social Determinants of Health   Financial Resource Strain:   . Difficulty of Paying Living Expenses:   Food Insecurity:   . Worried About Charity fundraiser in the Last Year:   . Arboriculturist in the Last Year:   Transportation Needs:   .  Lack of Transportation (Medical):   Marland Kitchen Lack of Transportation (Non-Medical):   Physical Activity:   . Days of Exercise per Week:   . Minutes of Exercise per Session:   Stress:   . Feeling of Stress :   Social Connections:   . Frequency of Communication with Friends and Family:   . Frequency of Social Gatherings with Friends and Family:   . Attends Religious Services:   . Active Member of Clubs or Organizations:   . Attends Banker Meetings:   Marland Kitchen Marital Status:        Allergies:   Allergies  Allergen Reactions  . Codeine   . Doxycycline   .  Latuda [Lurasidone Hcl]   . Penicillins   . Resperal-Dm [Pseudoeph-Bromphen-Dm]   . Topamax [Topiramate]     Metabolic Disorder Labs: No results found for: HGBA1C, MPG No results found for: PROLACTIN No results found for: CHOL, TRIG, HDL, CHOLHDL, VLDL, LDLCALC No results found for: TSH  Therapeutic Level Labs: No results found for: LITHIUM No results found for: CBMZ No results found for: VALPROATE  Current Medications: Current Outpatient Medications  Medication Sig Dispense Refill  . albuterol (PROVENTIL HFA;VENTOLIN HFA) 108 (90 BASE) MCG/ACT inhaler Inhale 2 puffs into the lungs every 6 (six) hours as needed for wheezing or shortness of breath. 1 Inhaler 0  . benzonatate (TESSALON) 200 MG capsule Take 1 capsule (200 mg total) by mouth at bedtime. Take as needed for cough 12 capsule 0  . benztropine (COGENTIN) 0.5 MG tablet Take 1 tablet (0.5 mg total) by mouth 2 (two) times daily. 60 tablet 1  . ibuprofen (ADVIL,MOTRIN) 800 MG tablet Take 800 mg by mouth every 8 (eight) hours as needed.    . medroxyPROGESTERone (PROVERA) 5 MG tablet Take 5 mg by mouth daily.    . montelukast (SINGULAIR) 10 MG tablet Take 10 mg by mouth at bedtime.    Marland Kitchen omeprazole (PRILOSEC) 40 MG capsule Take 40 mg by mouth daily.    . Oxcarbazepine (TRILEPTAL) 300 MG tablet Take 2 tablets (600 mg total) by mouth 2 (two) times daily. 60 tablet 1  . perphenazine (TRILAFON) 4 MG tablet Take 1 tablet (4 mg total) by mouth in the morning and at bedtime. Take 1 tablet by mouth in the morning and 2 at bedtime 90 tablet 1  . predniSONE (DELTASONE) 20 MG tablet Take 1 tablet (20 mg total) by mouth 2 (two) times daily. Take with food. 10 tablet 0  . sulfamethoxazole-trimethoprim (BACTRIM DS) 800-160 MG per tablet Take 1 tablet by mouth 2 (two) times daily. (Rx void after 02/14/14) 14 tablet 0  . venlafaxine XR (EFFEXOR-XR) 37.5 MG 24 hr capsule Take 3 capsules (112.5 mg total) by mouth daily with breakfast. 90 capsule 1    No current facility-administered medications for this visit.       Psychiatric Specialty Exam: Review of Systems  Cardiovascular: Negative for chest pain.  Skin: Negative for rash.  Psychiatric/Behavioral: Negative for substance abuse and suicidal ideas.    There were no vitals taken for this visit.There is no height or weight on file to calculate BMI.  General Appearance: Casual  Eye Contact:  Fair  Speech:  Normal Rate  Volume:  Decreased  Mood: fair  Affect:  Constricted  Thought Process:  Goal Directed  Orientation:  Full (Time, Place, and Person)  Thought Content:  Rumination  Suicidal Thoughts:  No  Homicidal Thoughts:  No  Memory:  Immediate;   Fair  Recent;   Fair  Judgement:  Fair  Insight:  Shallow  Psychomotor Activity:  Normal  Concentration:  Concentration: Fair and Attention Span: Fair  Recall:  [employer] of Knowledge:Fair  Language: Fair  Akathisia:  No  Handed:  Right  AIMS (if indicated):  No involuntary movements  Assets:  Desire for Improvement Financial Resources/Insurance Leisure Time Physical Health  ADL's:  Intact  Cognition: WNL  Sleep:  Fair   Screenings:   Assessment and Plan: as follows Schizoaffective disorder, depressed type: doing fair, continue meds including  trileptal, perphenazaine . No tremors Continue cogentin   GAD: fluctuates, working on coping skills. Continue therapy  Past history of OD on benzo, will avoid and she is working on Pharmacologist and attends an Emotionally annonymous group and has sponsor to call Insomnia; not worse. Continue sleep hygiene  I discussed the assessment and treatment plan with the patient. The patient was provided an opportunity to ask questions and all were answered. The patient agreed with the plan and demonstrated an understanding of the instructions.   The patient was advised to call back or seek an in-person evaluation if the symptoms worsen or if the condition fails to improve as  anticipated. Non face to face time spent: .  Fu 3 m. meds refilled   Thresa Ross, MD 3/29/20213:38 PM

## 2020-03-06 ENCOUNTER — Other Ambulatory Visit (HOSPITAL_COMMUNITY): Payer: Self-pay

## 2020-03-06 MED ORDER — VENLAFAXINE HCL ER 37.5 MG PO CP24: 112.5000 mg | ORAL_CAPSULE | Freq: Every day | ORAL | 0 refills | Status: DC

## 2020-03-20 ENCOUNTER — Other Ambulatory Visit (HOSPITAL_COMMUNITY): Payer: Self-pay

## 2020-03-20 MED ORDER — PERPHENAZINE 4 MG PO TABS: 4.0000 mg | ORAL_TABLET | Freq: Two times a day (BID) | ORAL | 0 refills | Status: DC

## 2020-03-29 ENCOUNTER — Ambulatory Visit (HOSPITAL_COMMUNITY): Payer: Medicare Other | Admitting: Licensed Clinical Social Worker

## 2020-04-02 ENCOUNTER — Ambulatory Visit (INDEPENDENT_AMBULATORY_CARE_PROVIDER_SITE_OTHER): Payer: Medicare Other | Admitting: Licensed Clinical Social Worker

## 2020-04-02 DIAGNOSIS — F251 Schizoaffective disorder, depressive type: Secondary | ICD-10-CM

## 2020-04-02 DIAGNOSIS — F411 Generalized anxiety disorder: Secondary | ICD-10-CM

## 2020-04-02 DIAGNOSIS — F5102 Adjustment insomnia: Secondary | ICD-10-CM | POA: Diagnosis not present

## 2020-04-02 NOTE — Progress Notes (Signed)
Virtual Visit via Video Note  I connected with Shannon Boyd on 04/02/20 at 10:00 AM EDT by a video enabled telemedicine application and verified that I am speaking with the correct person using two identifiers.   I discussed the limitations of evaluation and management by telemedicine and the availability of in person appointments. The patient expressed understanding and agreed to proceed.   I discussed the assessment and treatment plan with the patient. The patient was provided an opportunity to ask questions and all were answered. The patient agreed with the plan and demonstrated an understanding of the instructions.   The patient was advised to call back or seek an in-person evaluation if the symptoms worsen or if the condition fails to improve as anticipated.  I provided 53 minutes of non-face-to-face time during this encounter.  THERAPIST PROGRESS NOTE  Session Time: 10:00 AM to 10:53 AM  Participation Level: Active  Behavioral Response: CasualAlertappropriate  Type of Therapy: Individual Therapy  Treatment Goals addressed: maintain stability with mood, learn and apply effective strategies to manage mood, coping  Interventions: Solution Focused, Strength-based, Supportive, Reframing and Other: coping, ACT  Summary: Shannon Boyd is a 34 y.o. female who presents with things have been going pretty well, testimony at [facility] that went pretty well, overall things good, small group at church meeting to discuss the sermon tonight. This will be her third time. Trying to do a few things and it has been ok. Had her birthday. Friends took her out for lunch, and mom took her out for lunch. A lot of people sent her cards, likes cards and keeps them. When had testimony most of the people knew so that was good. Patient explained more about the [facility] testimony how come to [facility] and how being a part of [facility] changed her life how it impacted hurts, habits, hand ups. Hers is depression, talked about how she dealt  with depression and how she is dealing with it now. Talking about her life. Prayer helps a lot. Hard to be upset with somebody when you are praying for them. Goal was to encourage people and prayed while she gave the talk. The prayer is a lot of being grateful. Saying thank you God for giving me the chance to say the talk and give me the courage of doing it. Mom joins her sometimes to walk. Worked on inventory last night related to sweets another [facility] step thing looked at incident where done bad and where you did well. Balance of things she needs to work on. Understanding weaknesses and where you can improve. Work on Step four and working on Step five next month. Cycle and work on each month. Does testimony every year usually when joined. Still losing weight here and there. Trying to work on not eating so many sweats. The key is when you fall off get back on wagon to start gain. Start each day. Now she has a group.(Discussed this earlier in session) Reviewed when she has negative thoughts. Depression at night when trying to sleep. Praying. Positive affirmation. Showed therapist arm with semi-colon, heart and butterfly. Explained this to therapist. Semi-colon project. How your story is not over even thought you have tried to hurt self. Butterfly and heart. Heart means that God loves her semi-colon means God not done with her story and still alive, butterfly reminds that will be with God when over. Also looks at negative thoughts-evil out there and sometimes messes with their thoughts if Bible is true, and message on hand true  then thoughts are not true. Because Bible and God say love her, hold on to those things, feels these are stronger than thoughts having. Bad influences out there that causes depression brewing her ugly head. Got another book revolves around prayer. Has found prayer helps her. Helps her to renew her focus on what she needs to learn and what spouse will have. Instead of being upset of not having one  prays about the qualities will have. What his mind will be like, his words, his relationship with family. Put a positive focus when lonely tried to not be focused on that. If there is a spouse out there to be praying about him. "31 prayers for my future husband". Ways to refocus.   Suicidal/Homicidal: No  Therapist Response: Therapist reviewed symptoms, facilitated expression of thoughts and feelings. Provided education on compassionate meditation.  Taking time to be compassionate to yourself or to others.  Discussed how the same positive effects happen with prayer.  Reviewed neuroscience that it increases activation of insula and cingulate, increase social support, life purposes, mindfulness and self compassion, improve immune response and enhance his functioning in brain areas involved in empathy and emotional processing.  Provided positive feedback for patient's ability to connect this with her practices of praying for intermedius and how that helps.  Provided positive feedback and assessed patient making progress on goal as she continues to be consistent and steps she is taking toward growth and adding new steps such as addressing sweets, joining a small group, working through her fear and giving her testimony.  Reinforcement use as a motivational strategy.  Reviewed balance as effective strategy and working on oneself to look at 1 strengths as well as working on weaknesses helpful and working on goals.  Reviewed crucial skill of picking oneself up when one falls off track as the important part of working on goals.  Reviewed not being enmeshed with thoughts and externalizing and again spirituality and prayer helpful for that.  Provided positive feedback for patient refocusing in dealing with being lonely by praying to spouse and discussed reframing is very helpful strategy.  Therapist provided strength based and supportive interventions  Plan: Return again in 4 weeks.2.Work with patient on coping on  depression, provide support for her steps of growth.   Diagnosis: Axis I: schizoaffective disorder, depressive type, generalized anxiety disorder, adjustment insomnia    Axis II: No diagnosis    Coolidge Breeze, LCSW 04/02/2020

## 2020-04-15 NOTE — Progress Notes (Signed)
New Patient Office Visit  Subjective:  Patient ID: Shannon Boyd, female    DOB: 1986-06-11  Age: 33 y.o. MRN: [id number]  CC:  Chief Complaint  Patient presents with  . Establish Care  . Depression    PHQ-9: 9, somewhat difficult  . Anxiety    GAD-7: 5, somewhat difficult    HPI Shannon Boyd presents to establish care.  HTN- Recently decided to come off of blood pressure medications as she has started working on weight loss.  Depression/Schizoaffective disorder- followed by psychiatry and counseling.  Obesity- Working with [facility] on weight loss.  GERD- has taken omeprazole for years but recently had an uptick in symptoms. Tried switching to Protonix but symptoms became much worse. Now back on omeprazole 40mg  DR daily. Continuing to experience breakthrough reflux symptoms and bad breath.  Past Medical History:  Diagnosis Date  . Anxiety   . Asthma   . Depression   . History of fracture of toe   . History of sprained ankle   . Hypertension   . Schizoaffective disorder [facility])     Past Surgical History:  Procedure Laterality Date  . ADENOIDECTOMY    . SALPINGECTOMY Bilateral   . WISDOM TOOTH EXTRACTION      Family History  Problem Relation Age of Onset  . Depression Mother   . Anxiety disorder Mother   . Seizures Mother   . Breast cancer Mother   . Dementia Mother   . Schizophrenia Father   . Stroke Maternal Grandmother     Social History   Socioeconomic History  . Marital status: Single    Spouse name: Not on file  . Number of children: Not on file  . Years of education: Not on file  . Highest education level: Not on file  Occupational History  . Not on file  Tobacco Use  . Smoking status: Never Smoker  . Smokeless tobacco: Never Used  Substance and Sexual Activity  . Alcohol use: Yes    Comment: Rarely  . Drug use: No  . Sexual activity: Never  Other Topics Concern  . Not on file  Social History Narrative  . Not on file   Social  Determinants of Health   Financial Resource Strain:   . Difficulty of Paying Living Expenses:   Food Insecurity:   . Worried About Charity fundraiser in the Last Year:   . Arboriculturist in the Last Year:   Transportation Needs:   . Film/video editor (Medical):   Marland Kitchen Lack of Transportation (Non-Medical):   Physical Activity:   . Days of Exercise per Week:   . Minutes of Exercise per Session:   Stress:   . Feeling of Stress :   Social Connections:   . Frequency of Communication with Friends and Family:   . Frequency of Social Gatherings with Friends and Family:   . Attends Religious Services:   . Active Member of Clubs or Organizations:   . Attends Archivist Meetings:   Marland Kitchen Marital Status:   Intimate Partner Violence:   . Fear of Current or Ex-Partner:   . Emotionally Abused:   Marland Kitchen Physically Abused:   . Sexually Abused:     ROS Review of Systems  Constitutional: Negative for chills, fatigue and fever.  Respiratory: Negative for cough, chest tightness and shortness of breath.   Cardiovascular: Negative for chest pain, palpitations and leg swelling.  Gastrointestinal: Positive for abdominal pain.  Neurological: Negative for dizziness, light-headedness  and headaches.  Psychiatric/Behavioral: Positive for sleep disturbance. Negative for self-injury and suicidal ideas.    Objective:   Today's Vitals: BP 138/83   Pulse 81   Temp (!) 97.2 F (36.2 C) (Oral)   Ht 5\' 5"  (1.651 m)   Wt 249 lb 8 oz (113.2 kg)   LMP 04/10/2020   SpO2 97%   BMI 41.52 kg/m   Physical Exam Vitals reviewed.  Constitutional:      General: She is not in acute distress.    Appearance: Normal appearance.  HENT:     Head: Normocephalic and atraumatic.  Cardiovascular:     Rate and Rhythm: Normal rate and regular rhythm.     Pulses: Normal pulses.     Heart sounds: Normal heart sounds. No murmur. No friction rub. No gallop.   Pulmonary:     Effort: Pulmonary effort is normal. No  respiratory distress.     Breath sounds: Normal breath sounds. No wheezing.  Abdominal:     General: Abdomen is flat. Bowel sounds are normal. There is no distension.     Palpations: There is no mass.     Tenderness: There is no abdominal tenderness. There is no guarding or rebound.     Hernia: No hernia is present.  Skin:    General: Skin is warm and dry.  Neurological:     Mental Status: She is alert and oriented to person, place, and time.  Psychiatric:        Mood and Affect: Mood normal.        Behavior: Behavior normal.        Thought Content: Thought content normal.        Judgment: Judgment normal.     Assessment & Plan:   1. Encounter to establish care Reviewed medical records and discussed history with patient.  2. Essential hypertension Monitor blood pressure. Borderline in office. Recommend obtaining a blood pressure cuff that measures on the arm to monitor blood pressures at home.  3. Gastroesophageal reflux disease without esophagitis Continue omeprazole DR 40 mg daily.  Adding in famotidine 20 mg nightly to see if this helps control symptoms better.  4. Schizoaffective disorder, depressive type (HCC) Managed by psychiatry.  5. Morbid (severe) obesity due to excess calories (HCC) Continue care with [facility] for weight management.  6. Major depressive disorder, remission status unspecified, unspecified whether recurrent Managed by psychiatry.  7. Halitosis Adding famotidine to omeprazole for better reflux control.  Discussed potential causes of halitosis.  Patient does endorse dry mouth from medication side effects from Cogentin.  Recommend sugar-free gum/candy.  Also recommend trialing Biotene mouthcare products.  8. Screening for human immunodeficiency virus Discussed recommended screening.  Patient amenable to having this drawn at next blood draw. - HIV Antibody (routine testing w rflx)   Outpatient Encounter Medications as of 04/16/2020   Medication Sig  . benztropine (COGENTIN) 0.5 MG tablet Take 1 tablet (0.5 mg total) by mouth 2 (two) times daily.  . Cholecalciferol (VITAMIN D3) 50 MCG (2000 UT) capsule Take 2,000 Units by mouth daily.  04/18/2020 ibuprofen (ADVIL,MOTRIN) 800 MG tablet Take 800 mg by mouth every 8 (eight) hours as needed.  . melatonin 1 MG TABS tablet Take 3 mg by mouth at bedtime.  Marland Kitchen omeprazole (PRILOSEC) 40 MG capsule Take 40 mg by mouth daily.  . Oxcarbazepine (TRILEPTAL) 300 MG tablet Take 2 tablets (600 mg total) by mouth 2 (two) times daily.  Marland Kitchen perphenazine (TRILAFON) 4 MG tablet Take  1 tablet (4 mg total) by mouth in the morning and at bedtime. Take 1 tablet by mouth in the morning and 2 at bedtime  . venlafaxine XR (EFFEXOR-XR) 37.5 MG 24 hr capsule Take 3 capsules (112.5 mg total) by mouth daily with breakfast.  . albuterol (PROVENTIL HFA;VENTOLIN HFA) 108 (90 BASE) MCG/ACT inhaler Inhale 2 puffs into the lungs every 6 (six) hours as needed for wheezing or shortness of breath. (Patient not taking: Reported on 04/16/2020)  . benzonatate (TESSALON) 200 MG capsule Take 1 capsule (200 mg total) by mouth at bedtime. Take as needed for cough (Patient not taking: Reported on 04/16/2020)  . famotidine (PEPCID) 20 MG tablet Take 1 tablet (20 mg total) by mouth 2 (two) times daily.  . medroxyPROGESTERone (PROVERA) 5 MG tablet Take 5 mg by mouth daily.  . montelukast (SINGULAIR) 10 MG tablet Take 10 mg by mouth at bedtime.  . predniSONE (DELTASONE) 20 MG tablet Take 1 tablet (20 mg total) by mouth 2 (two) times daily. Take with food. (Patient not taking: Reported on 04/16/2020)  . sulfamethoxazole-trimethoprim (BACTRIM DS) 800-160 MG per tablet Take 1 tablet by mouth 2 (two) times daily. (Rx void after 02/14/14) (Patient not taking: Reported on 04/16/2020)  . [DISCONTINUED] clonazePAM (KLONOPIN) 0.5 MG tablet Take 0.25 mg by mouth daily.  . [DISCONTINUED] propranolol (INDERAL) 10 MG tablet Take 5 mg by mouth daily.  .  [DISCONTINUED] sertraline (ZOLOFT) 100 MG tablet Take 100 mg by mouth daily.   No facility-administered encounter medications on file as of 04/16/2020.    Follow-up: Return in about 4 weeks (around 05/14/2020) for GERD follow up.   Thayer Ohm, DNP, APRN, FNP-BC [facility]

## 2020-04-16 ENCOUNTER — Ambulatory Visit (INDEPENDENT_AMBULATORY_CARE_PROVIDER_SITE_OTHER): Payer: Medicare Other | Admitting: Medical-Surgical

## 2020-04-16 ENCOUNTER — Telehealth: Payer: Self-pay

## 2020-04-16 ENCOUNTER — Encounter: Payer: Self-pay | Admitting: Medical-Surgical

## 2020-04-16 VITALS — BP 138/83 | HR 81 | Temp 97.2°F | Ht 65.0 in | Wt 249.5 lb

## 2020-04-16 DIAGNOSIS — Z7689 Persons encountering health services in other specified circumstances: Secondary | ICD-10-CM | POA: Diagnosis not present

## 2020-04-16 DIAGNOSIS — F251 Schizoaffective disorder, depressive type: Secondary | ICD-10-CM

## 2020-04-16 DIAGNOSIS — I10 Essential (primary) hypertension: Secondary | ICD-10-CM | POA: Diagnosis not present

## 2020-04-16 DIAGNOSIS — R196 Halitosis: Secondary | ICD-10-CM

## 2020-04-16 DIAGNOSIS — F329 Major depressive disorder, single episode, unspecified: Secondary | ICD-10-CM

## 2020-04-16 DIAGNOSIS — Z114 Encounter for screening for human immunodeficiency virus [HIV]: Secondary | ICD-10-CM

## 2020-04-16 DIAGNOSIS — K219 Gastro-esophageal reflux disease without esophagitis: Secondary | ICD-10-CM

## 2020-04-16 MED ORDER — FAMOTIDINE 20 MG PO TABS: 20.0000 mg | ORAL_TABLET | Freq: Two times a day (BID) | ORAL | 1 refills | Status: DC

## 2020-04-16 MED ORDER — FAMOTIDINE 20 MG PO TABS: 20.0000 mg | ORAL_TABLET | Freq: Every day | ORAL | 1 refills | Status: DC

## 2020-04-16 NOTE — Addendum Note (Signed)
Addended byChristen Butter on: 04/16/2020 03:36 PM   Modules accepted: Orders

## 2020-04-16 NOTE — Telephone Encounter (Signed)
LVMTRC (1st attempt)   

## 2020-04-16 NOTE — Telephone Encounter (Signed)
Pt states she was told during her OV today to start famotidine taking it at night, but the Rx sig says to take 1 tab po BID. Pt is wanting to know how she should be taking this Rx.

## 2020-04-16 NOTE — Telephone Encounter (Signed)
She is correct that it is once a day at night. Please apologize for my mistake entering the order.

## 2020-04-17 NOTE — Telephone Encounter (Signed)
LVMTRC (2nd attempt)  

## 2020-04-17 NOTE — Telephone Encounter (Signed)
Pt aware of correct sig for famotidine 20 mg. No further questions or concerns at this time.

## 2020-04-23 ENCOUNTER — Other Ambulatory Visit: Payer: Self-pay | Admitting: Medical-Surgical

## 2020-04-23 DIAGNOSIS — R196 Halitosis: Secondary | ICD-10-CM

## 2020-04-23 DIAGNOSIS — K219 Gastro-esophageal reflux disease without esophagitis: Secondary | ICD-10-CM

## 2020-05-01 ENCOUNTER — Ambulatory Visit (INDEPENDENT_AMBULATORY_CARE_PROVIDER_SITE_OTHER): Payer: Medicare Other | Admitting: Licensed Clinical Social Worker

## 2020-05-01 DIAGNOSIS — F5102 Adjustment insomnia: Secondary | ICD-10-CM | POA: Diagnosis not present

## 2020-05-01 DIAGNOSIS — F251 Schizoaffective disorder, depressive type: Secondary | ICD-10-CM

## 2020-05-01 DIAGNOSIS — F411 Generalized anxiety disorder: Secondary | ICD-10-CM

## 2020-05-01 NOTE — Progress Notes (Addendum)
Virtual Visit via Video Note-patient at home and therapist at home I connected with Shannon Boyd on 05/01/20 at 11:00 AM EDT by a video enabled telemedicine application and verified that I am speaking with the correct person using two identifiers.   I discussed the limitations of evaluation and management by telemedicine and the availability of in person appointments. The patient expressed understanding and agreed to proceed.   I discussed the assessment and treatment plan with the patient. The patient was provided an opportunity to ask questions and all were answered. The patient agreed with the plan and demonstrated an understanding of the instructions.   The patient was advised to call back or seek an in-person evaluation if the symptoms worsen or if the condition fails to improve as anticipated.  I provided 55 minutes of non-face-to-face time during this encounter.   THERAPIST PROGRESS NOTE  Session Time: 11:00 AM to 11:55 AM  Participation Level: Active  Behavioral Response: CasualAlertDysphoric and Euthymic  Type of Therapy: Individual Therapy  Treatment Goals addressed: maintain stability with mood, learn and apply effective strategies to manage mood, coping   Interventions: Solution Focused, Strength-based, Supportive, Reframing and Other: coping  Summary: Shannon Boyd is a 34 y.o. female who presents with second dose of vaccine but had a lot of chills and not sure if supposed to, not sure if need to check with doctor. Yesterday was hard in reference to one of struggles of eating sugar and chocolate. Try to eat things that aren't chocolate didn't work, tried to pray. Therapist mentioned that chocolate can be good for you and possibly do more research.  Patient relates something she cannot do without so thinks it may be some type of addiction. First thing tried to eat was pomegranate strip sweet but not what body craving. [place] Yogurt bar covered with chocolate. Went to store and  bought a dark chocolate and had a portion so didn't help. Stays in portion size find herself wanting everyday, concerned her and want to work on it. Not sure if body craves it. Applying the 12 steps with it. On step 4 which is inventory. One of the things that showed with sponsor was incident in college so desperate for chocolate that ate a popsicle that didn't belong to her. Times in past where dependent on having sugar and at point of not affording her own, ate somebody else's and felt bad. Try to leave it alone because felt horrible. Recognize that she struggles with is mainly eating chocolate. With her inventory is identifying struggled with it and how not in control of it. Not sure how can complete steps but trying. Think will come up in terms of how to deal at least as far as steps will help guide her. Goes to dieticians for weight loss stuff. Bring that up for next meeting. Maybe there is a substitute for this. Find herself thinking about it, even though praying about it and made it worse. Also patient talked about small group with church found out not ready to be a leader yet. According to them she is impatient and didn't have realistic expectations for them. Thought God calling to be a leader of this small group. Been a believer and think she knows a few things, learned that not just knowledge of Bible but other things factor into leading, time management, communication. Found out maybe not ready as she thought she was. Advice of church leader was for her to be a Holiday representative. Maybe thought would be more of friends.  Honest at the beginning about that. Hurt for a little bit. Came to the decision that Greeting be the best for her and can serve. Maybe better at that. Last time went didn't go to meeting, embarrassed. Told them she wanted to  quit. They told her they have a lot going on and give [place] some grace. Put off by the whole thing. Learn better off being a greeter. Relates another aspect of the group is that  they are supposed to meet if everybody meets. Quit because frustrated not getting as much response as she thought she would expected more participation. Sent in group text and didn't get much of a response.  Group lady feedback was that it is trial and error. Patient relates thinks she needs more time and may start another group.      Suicidal/Homicidal: No  Therapist Response: Reviewed symptoms, facilitated expression of thoughts and framing, updated treatment plan and patient gave consent to complete virtually.  Utilize reframing to work on her goal of addiction to sweets particularly dark chocolate, discussed that it has benefits encouraged her to do more research, talk to her dietitian that she may be able to manage this with portion control, also have a different relationship to dark chocolate realizing it has benefits.  Reviewed with patient feedback from church group gust feedback is helpful for growth but at the same time strategies to help make it constructive is listen to what seems helpful for growth, use what is useful but not to allow it to be destructive, ways that are not helpful.  Reviewed coming to feedback with an attitude of self respect and self acceptance helps and receiving feedback.  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  Also discussed working on her goal of less sweets realizing working toward the goal can be a struggle and the important part in the role strength comes from getting back on track.  Therapist provided patient with ongoing emotional support and encouragement.  Normalized her feelings, commended her on her progress and reinforced the importance of client focused on her strengths and resiliency.  Processed various strategies for dealing with stressors.    Plan: Return again in 4 weeks.2.Work with patient on coping on depression, provide support for her steps of growth.   Diagnosis: Axis I: schizoaffective disorder, depressive type, generalized anxiety disorder, adjustment insomnia    Axis II: No diagnosis    Cordella Register, LCSW 05/01/2020

## 2020-05-03 ENCOUNTER — Other Ambulatory Visit (HOSPITAL_COMMUNITY): Payer: Self-pay | Admitting: Psychiatry

## 2020-05-03 MED ORDER — OXCARBAZEPINE 300 MG PO TABS: 300.0000 mg | ORAL_TABLET | Freq: Two times a day (BID) | ORAL | 1 refills | Status: DC

## 2020-05-03 NOTE — Telephone Encounter (Signed)
sent 

## 2020-05-03 NOTE — Telephone Encounter (Signed)
Pt LM regarding Trileptal.  She states we have the rx wrong. It should be written as 1 300mg  2x daily.  She will need [facility]ill [facility].   CB [phone]

## 2020-05-07 ENCOUNTER — Other Ambulatory Visit: Payer: Self-pay | Admitting: Medical-Surgical

## 2020-05-07 DIAGNOSIS — R196 Halitosis: Secondary | ICD-10-CM

## 2020-05-07 DIAGNOSIS — K219 Gastro-esophageal reflux disease without esophagitis: Secondary | ICD-10-CM

## 2020-05-09 ENCOUNTER — Other Ambulatory Visit: Payer: Self-pay

## 2020-05-09 MED ORDER — OMEPRAZOLE 40 MG PO CPDR: 40.0000 mg | DELAYED_RELEASE_CAPSULE | Freq: Every day | ORAL | 1 refills | Status: DC

## 2020-05-14 ENCOUNTER — Ambulatory Visit: Payer: Medicare Other | Admitting: Medical-Surgical

## 2020-05-28 ENCOUNTER — Telehealth (INDEPENDENT_AMBULATORY_CARE_PROVIDER_SITE_OTHER): Payer: Medicare Other | Admitting: Psychiatry

## 2020-05-28 ENCOUNTER — Encounter (HOSPITAL_COMMUNITY): Payer: Self-pay | Admitting: Psychiatry

## 2020-05-28 DIAGNOSIS — F411 Generalized anxiety disorder: Secondary | ICD-10-CM

## 2020-05-28 DIAGNOSIS — F251 Schizoaffective disorder, depressive type: Secondary | ICD-10-CM

## 2020-05-28 MED ORDER — VENLAFAXINE HCL ER 37.5 MG PO CP24: 112.5000 mg | ORAL_CAPSULE | Freq: Every day | ORAL | 0 refills | Status: DC

## 2020-05-28 MED ORDER — PERPHENAZINE 4 MG PO TABS: 4.0000 mg | ORAL_TABLET | Freq: Two times a day (BID) | ORAL | 0 refills | Status: DC

## 2020-05-28 NOTE — Progress Notes (Signed)
[facility] Follow up visit  Patient Identification: Tanya Crothers MRN:  [id number] Date of Evaluation:  05/28/2020 Referral Source: primary care. Also by our Therapist [facility] Chief Complaint:   follow up med review Visit Diagnosis:    ICD-10-CM   1. Schizoaffective disorder, depressive type ([place])  F25.1   2. GAD (generalized anxiety disorder)  F41.1       I connected with Rhiana Dierks on 05/28/20 at  3:00 PM EDT by a video enabled telemedicine application and verified that I am speaking with the correct person using two identifiers.  I discussed the limitations of evaluation and management by telemedicine and the availability of in person appointments. The patient expressed understanding and agreed to proceed.  Patient location: home Provider location : home  History of Present Illness: Patient is a 34 years old currently single Caucasian female is living with her mom she is currently on disability for schizophrenia, obesity and other diagnoses.  She wanted to move her services from [facility] as she is not having regular appointments or difficulty at times feeling of medications  Brief history " She has depression episodes of withdrawn apathy decreased energy, withdrawn, hopeless with numerous suicidal attempts or ideations leading to hospitalizations. Last was in 2017 '  Doing fair on meds,  Cogentin helps EPS if any Some tactile hallucinations, sexual in nature at night  She is attending a support group for depression that helps and therapy   Aggravating factor: difficult childhood and dealing with mom. lonliness  Modifying factor: housing Duration since young age Drug use : denies  Past psych meds: Latuda, risperdal. abilify . Had side effects   Past Psychiatric History: schizoaffective disorder  Previous Psychotropic Medications: Yes   Substance Abuse History in the last 12 months:  No.  Consequences of Substance Abuse: NA  Past Medical History:  Past Medical History:   Diagnosis Date   Anxiety    Asthma    Depression    History of fracture of toe    History of sprained ankle    Hypertension    Schizoaffective disorder ([place])     Past Surgical History:  Procedure Laterality Date   ADENOIDECTOMY     SALPINGECTOMY Bilateral    WISDOM TOOTH EXTRACTION      Family Psychiatric History: Mom told patient . Her dad had schizophrenia  Family History:  Family History  Problem Relation Age of Onset   Depression Mother    Anxiety disorder Mother    Seizures Mother    Breast cancer Mother    Dementia Mother    Schizophrenia Father    Stroke Maternal Grandmother     Social History:   Social History   Socioeconomic History   Marital status: Single    Spouse name: Not on file   Number of children: Not on file   Years of education: Not on file   Highest education level: Not on file  Occupational History   Not on file  Tobacco Use   Smoking status: Never Smoker   Smokeless tobacco: Never Used  Vaping Use   Vaping Use: Never used  Substance and Sexual Activity   Alcohol use: Yes    Comment: Rarely   Drug use: No   Sexual activity: Never  Other Topics Concern   Not on file  Social History Narrative   Not on file   Social Determinants of Health   Financial Resource Strain:    Difficulty of Paying Living Expenses:   Food Insecurity:  Worried About Programme researcher, broadcasting/film/video in the Last Year:    Barista in the Last Year:   Transportation Needs:    Freight forwarder (Medical):    Lack of Transportation (Non-Medical):   Physical Activity:    Days of Exercise per Week:    Minutes of Exercise per Session:   Stress:    Feeling of Stress :   Social Connections:    Frequency of Communication with Friends and Family:    Frequency of Social Gatherings with Friends and Family:    Attends Religious Services:    Active Member of Clubs or Organizations:    Attends Banker  Meetings:    Marital Status:        Allergies:   Allergies  Allergen Reactions   Aripiprazole Swelling   Codeine    Doxycycline    Latuda [Lurasidone Hcl]    Lurasidone Nausea And Vomiting    Reaction unknown Reaction unknown    Other Swelling   Penicillins    Pseudoeph-Bromphen-Dm     Other reaction(s): Unknown   Resperal-Dm [Pseudoeph-Bromphen-Dm]    Risperidone And Related Hives   Sulfa Antibiotics Other (See Comments)    Pt can't remember   Topamax [Topiramate]     Metabolic Disorder Labs: No results found for: HGBA1C, MPG No results found for: PROLACTIN No results found for: CHOL, TRIG, HDL, CHOLHDL, VLDL, LDLCALC No results found for: TSH  Therapeutic Level Labs: No results found for: LITHIUM No results found for: CBMZ No results found for: VALPROATE  Current Medications: Current Outpatient Medications  Medication Sig Dispense Refill   benztropine (COGENTIN) 0.5 MG tablet Take 1 tablet (0.5 mg total) by mouth 2 (two) times daily. 60 tablet 1   Cholecalciferol (VITAMIN D3) 50 MCG (2000 UT) capsule Take 2,000 Units by mouth daily.     famotidine (PEPCID) 20 MG tablet TAKE 1 TABLET BY MOUTH EVERY DAY 90 tablet 1   ibuprofen (ADVIL,MOTRIN) 800 MG tablet Take 800 mg by mouth every 8 (eight) hours as needed.     melatonin 1 MG TABS tablet Take 3 mg by mouth at bedtime.     omeprazole (PRILOSEC) 40 MG capsule Take 1 capsule (40 mg total) by mouth daily. 90 capsule 1   Oxcarbazepine (TRILEPTAL) 300 MG tablet Take 1 tablet (300 mg total) by mouth 2 (two) times daily. 60 tablet 1   perphenazine (TRILAFON) 4 MG tablet Take 1 tablet (4 mg total) by mouth in the morning and at bedtime. Take 1 tablet by mouth in the morning and 2 at bedtime 270 tablet 0   venlafaxine XR (EFFEXOR-XR) 37.5 MG 24 hr capsule Take 3 capsules (112.5 mg total) by mouth daily with breakfast. 270 capsule 0   No current facility-administered medications for this visit.        Psychiatric Specialty Exam: Review of Systems  Cardiovascular: Negative for chest pain.  Skin: Negative for rash.  Psychiatric/Behavioral: Negative for substance abuse and suicidal ideas.    There were no vitals taken for this visit.There is no height or weight on file to calculate BMI.  General Appearance: Casual  Eye Contact:  Fair  Speech:  Normal Rate  Volume:  Decreased  Mood: fair  Affect:  Constricted  Thought Process:  Goal Directed  Orientation:  Full (Time, Place, and Person)  Thought Content:  Rumination  Suicidal Thoughts:  No  Homicidal Thoughts:  No  Memory:  Immediate;   Fair Recent;   Fair  Judgement:  Fair  Insight:  Shallow  Psychomotor Activity:  Normal  Concentration:  Concentration: Fair and Attention Span: Fair  Recall:  [employer] of Knowledge:Fair  Language: Fair  Akathisia:  No  Handed:  Right  AIMS (if indicated):  No involuntary movements  Assets:  Desire for Improvement Financial Resources/Insurance Leisure Time Physical Health  ADL's:  Intact  Cognition: WNL  Sleep:  Fair   Screenings: GAD-7     Office Visit from 04/16/2020 in [facility]  Total GAD-7 Score 5    PHQ2-9     Office Visit from 04/16/2020 in [facility]  PHQ-2 Total Score 2  PHQ-9 Total Score 9      Assessment and Plan: as follows Schizoaffective disorder, depressed type: doing fair, continue trieptal.perphenazine  GAD: fluctuates, continue effexor  Past history of OD on benzo, will avoid and she is working on Pharmacologist and attends an Emotionally annonymous group and has sponsor to call Insomnia; not worse. Continue sleep hygiene  I discussed the assessment and treatment plan with the patient. The patient was provided an opportunity to ask questions and all were answered. The patient agreed with the plan and demonstrated an understanding of the instructions.   The patient was advised to  call back or seek an in-person evaluation if the symptoms worsen or if the condition fails to improve as anticipated. Non face to face time spent: .  Fu 3 m. meds refilled   Thresa Ross, MD 6/28/20213:12 PM

## 2020-05-29 ENCOUNTER — Ambulatory Visit (INDEPENDENT_AMBULATORY_CARE_PROVIDER_SITE_OTHER): Payer: Medicare Other | Admitting: Licensed Clinical Social Worker

## 2020-05-29 DIAGNOSIS — F411 Generalized anxiety disorder: Secondary | ICD-10-CM | POA: Diagnosis not present

## 2020-05-29 DIAGNOSIS — F251 Schizoaffective disorder, depressive type: Secondary | ICD-10-CM | POA: Diagnosis not present

## 2020-05-29 DIAGNOSIS — F5102 Adjustment insomnia: Secondary | ICD-10-CM | POA: Diagnosis not present

## 2020-05-29 NOTE — Progress Notes (Signed)
   THERAPIST PROGRESS NOTE  Session Time: 10:00 AM to 10:52 AM  Participation Level: Active  Behavioral Response: CasualAlertEuthymic  Type of Therapy: Individual Therapy  Treatment Goals addressed: maintain stability with mood, learn and apply effective strategies to manage mood, coping   Interventions: Solution Focused, Strength-based, Supportive and Reframing, coping  Summary: Shannon Boyd is a 34 y.o. female who presents with very busy this week. 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 Work but happy about it. Easier for her to get to sleep because of the work explains trouble sleeping related to somantic delusions as part of diagnosis, takes medicine, problems with sleep as a common occurrence. Why treatment due to diagnosis as well as  depression as well. Busy and it is good. 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 Will find out this week sees this job as good for her as well as the kids. 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 Scared if doesn't get it. Scared not what expect and not good enough not a lot of exerpeince with what they are asking lessons and projects. Praying. Discussed accurate view points to help her cope if she doesn't get it. Discussed from spiritual perspective that she can look at it as path not meant to take. Still concerned about spiraling downward. If doesn't get it. Discussed the strength as positive coping of looking for work and patient says she will "keep trying". Looked back at previous experiences and shares that she is glad didn't a certain job at the time and experienced something better out there for her. Spirituality helping her to cope. Maybe there is something better out there how has to try to look at it. Part-time can still do things with Mom. Spiritual part helps her from being depressed shares without it could commit  suicide again.  Likes in person and connection  Suicidal/Homicidal: No  Therapist Response: Therapist reviewed symptoms, facilitated expression of thoughts and feelings, talked about positive developments and experiences as helpful mood management strategy reinforcing patient's own positive feelings about positive situations, including keeping busy.  Work with patient on strategies to manage if she does not get the job, discussed taking a big picture approach and utilizing her own spirituality that it is not the right path, therapist put in her own words she will find somebody where she fits better, also recognition with job interviewing that rejection is part of the process, to normalize the experience that everybody has.  Also legitimate to be sad as a normal reaction if does not get what she wants.  Utilize strength-based and effective patient has been proactive in pursuing a job as something to feel good about and actually when the main strategies to finally find something is to keep looking for work.  Discussed patient's own experiences and teaching a strength-based intervention.  Therapist provided supportive interventions, active listening open questions.  Plan: Return again in 4 weeks.2.  Therapist work with and encourage patient on effective coping, allergies to maintain mood stability  Diagnosis: Axis I:  schizoaffective disorder, depressive type, generalized anxiety disorder, adjustment insomnia    Axis II: No diagnosis    Coolidge Breeze, LCSW 05/29/2020

## 2020-06-28 ENCOUNTER — Ambulatory Visit (HOSPITAL_COMMUNITY): Payer: Medicare Other | Admitting: Licensed Clinical Social Worker

## 2020-07-05 ENCOUNTER — Other Ambulatory Visit (HOSPITAL_COMMUNITY): Payer: Self-pay | Admitting: Psychiatry

## 2020-07-27 ENCOUNTER — Ambulatory Visit (INDEPENDENT_AMBULATORY_CARE_PROVIDER_SITE_OTHER): Payer: Medicare Other | Admitting: Licensed Clinical Social Worker

## 2020-07-27 DIAGNOSIS — F331 Major depressive disorder, recurrent, moderate: Secondary | ICD-10-CM | POA: Diagnosis not present

## 2020-07-27 DIAGNOSIS — F5102 Adjustment insomnia: Secondary | ICD-10-CM

## 2020-07-27 DIAGNOSIS — F251 Schizoaffective disorder, depressive type: Secondary | ICD-10-CM

## 2020-07-27 DIAGNOSIS — F411 Generalized anxiety disorder: Secondary | ICD-10-CM | POA: Diagnosis not present

## 2020-07-27 NOTE — Progress Notes (Signed)
Virtual Visit via Telephone Note  Therapist-home office Patient-home I connected with Bobbiejo Essick on 07/27/20 at  9:00 AM EDT by telephone and verified that I am speaking with the correct person using two identifiers.   I discussed the limitations, risks, security and privacy concerns of performing an evaluation and management service by telephone and the availability of in person appointments. I also discussed with the patient that there may be a patient responsible charge related to this service. The patient expressed understanding and agreed to proceed.   I discussed the assessment and treatment plan with the patient. The patient was provided an opportunity to ask questions and all were answered. The patient agreed with the plan and demonstrated an understanding of the instructions.   The patient was advised to call back or seek an in-person evaluation if the symptoms worsen or if the condition fails to improve as anticipated.  I provided 45 minutes of non-face-to-face time during this encounter.  THERAPIST PROGRESS NOTE  Session Time: 9:00 AM to 9:45 AM  Participation Level: Active  Behavioral Response: CasualAlertEuthymic  Type of Therapy: Individual Therapy  Treatment Goals addressed:  maintain stability with mood, learn and apply effective strategies to manage mood, coping  Interventions: CBT, Solution Focused, Strength-based, Supportive and Other: coping  Summary: Mahina Salatino is a 34 y.o. female who presents with she did get a job. Not the one that talked to therapist about. Did get an offer from [employer] offer. Started in late July. She enjoys most of the work some of it can be kind of stressful. 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 It has gotten better and know where things are so things improving. Glad to have the job. Some adjustment going on. Have to make sure in the  morning to get things done with Mom. Called in for other people which can be annoying and she is filling in for people not doing their job but on the other hand they like patient and told her that they can depend on her. Shift 4-5 hours. Hurting feet become an issue. Has orthopedic insoles. She is talking off weight as well. Also has church which has changed and [employer]. 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 "I enjoy work". Mental health is in a good place. First wants to focus on learning what she needs to do in order to job before branching out. Nice to check in and talk about things. Has a topic over the phone doesn't comfortable over the phone and will talk to therapist in person. Recently changed churches. Originally going Thursday to last church. Loved the people but other things bothering her. Switched and going to a [employer] and hopes it will be good for her and them. It seems they are just starting out and like her participation. Hopeful about it. Renato Gails likes to think it is non-denominational made it clear connected to [employer]. Part of it but not main focus. Focus is being the church for everyone. What is nice about the pastor, not agree on all doctrine but principles not debatable. Patient own spirituality she doesn't have a  particular affiliation. Went to [employer] for new church last weekend so this was the first time went where she went and learned about the church. They have younger folks talk to a cute guy, but focus on finding on home church. Concern with other church don't have at  this church. Started greeting at her other church. Found out one of the reason she left tied to experience when she was greeting. 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 She recognizes about herself when busier better for her mood. Isolation not good for me. Since started working inspiring to work on her art. Started painting again which is really good.  Inspired to do a [employer] type painting. Mom asked her if she could create something for a wall. She enjoyed it and "it was kind of cool". 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 Rewarding when somebody struggling with something and get to them what they need very fulfilling. Know they are trying to create something so feels good about that. [employer] slogan is that "we are here for the makers" and help people with that. Cool when they share what they are trying to create.    Therapist reviewed symptoms, facilitated expression of thoughts and feelings.  Identified both going to new church and new job as positive developments for patient that had a positive impact on her mental health.  Identified aspect of keeping busy is helpful for mental health.  Explained an important strategy for depression in general is to be more active so very much that is helpful, isolation tends to increase depression and patient herself recognizes that in herself.  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  Related 1 can feel a sense of mastery and achievement in doing well at work a goal to work toward so positive this is happened for patient.  When one enjoys once work it enhances the quality of one's life as well.  Discussed think she is struggling with that work to expect when you are new can be a struggle with part of it it eventually she will get used to it and then will become easier.  Therapist shared her appreciation for patient's outlook of her her customers who are in the process of creating something so enjoys being able to help him with that.  Discussed perspective taking at a deeper level that allows her to see the value of her experience with her customers allows for deeper appreciation and richness to life when one is able to take a deeper and appreciative perspective. Discussed  patient engaging in creative process is also very helpful for mental health.  Discussed patient's quality of empathy as very helpful quality for her work.  Therapist provided active listening open questions supportive interventions Suicidal/Homicidal: No  Plan: Return again in 4-5 weeks.2. Therapist work with and encourage patient on effective coping, allergies to maintain mood stability Diagnosis: Axis I:   schizoaffective disorder, depressive type, generalized anxiety disorder, adjustment insomnia      Axis II: No diagnosis    Coolidge Breeze, LCSW 07/27/2020

## 2020-08-24 ENCOUNTER — Emergency Department (INDEPENDENT_AMBULATORY_CARE_PROVIDER_SITE_OTHER)
Admission: EM | Admit: 2020-08-24 | Discharge: 2020-08-24 | Disposition: A | Discharge status: Home / Self Care | Payer: Medicare Other | Source: Home / Self Care

## 2020-08-24 DIAGNOSIS — H109 Unspecified conjunctivitis: Secondary | ICD-10-CM

## 2020-08-24 MED ORDER — GENTAMICIN SULFATE 0.3 % OP SOLN: 2.0000 [drp] | Freq: Four times a day (QID) | OPHTHALMIC | 0 refills | Status: AC

## 2020-08-24 NOTE — ED Triage Notes (Signed)
Patient presents to Urgent Care with complaints of right eye drainage and redness since earlier today. Patient reports she removed her contacts and tried to flush it out but it has not improved. Pt called out of work and will need a note for today.

## 2020-08-24 NOTE — ED Provider Notes (Signed)
[facility]    CSN: [id number] Arrival date & time: 08/24/20  1607      History   Chief Complaint Chief Complaint  Patient presents with  . Eye Pain    HPI Shannon Boyd is a 34 y.o. female.   HPI  Shannon Boyd is a 34 y.o. female presenting to UC with c/o several weeks of gradually worsening Right eye irritation and discharge. She notes today she had to call out of work due to eye drainage and irritation when taking her contact out.  Pain is mildly sore and itchy.  She changed to a different brand of monthly contact lenses at the beginning of the year and reports having more irritation now than she had with prior contacts.  Denies URI symptoms. No change in vision besides when discharge gets bad. No HA, dizziness, fever or chills.    Past Medical History:  Diagnosis Date  . Anxiety   . Asthma   . Depression   . History of fracture of toe   . History of sprained ankle   . Hypertension   . Schizoaffective disorder [facility])     Patient Active Problem List   Diagnosis Date Noted  . Allergic rhinitis 10/31/2019  . Insomnia 10/31/2019  . Arthritis 07/06/2019  . Asthma 07/06/2019  . Hypersomnia 03/15/2015  . Morbid (severe) obesity due to excess calories (HCC) 03/15/2015  . Hypertension   . History of fracture of toe   . History of sprained ankle   . Depression   . Schizoaffective disorder (HCC)   . GERD (gastroesophageal reflux disease) 04/09/2012  . Low back pain 04/09/2012  . Migraine with aura and without status migrainosus, not intractable 04/09/2012    Past Surgical History:  Procedure Laterality Date  . ADENOIDECTOMY    . SALPINGECTOMY Bilateral   . WISDOM TOOTH EXTRACTION      OB History   No obstetric history on file.      Home Medications    Prior to Admission medications   Medication Sig Start Date End Date Taking? Authorizing Provider  benztropine (COGENTIN) 0.5 MG tablet TAKE 1 TABLET BY MOUTH 2 TIMES DAILY. 07/05/20   Thresa Ross, MD  Cholecalciferol (VITAMIN D3) 50 MCG (2000 UT) capsule Take 2,000 Units by mouth daily.    [provider]  famotidine (PEPCID) 20 MG tablet TAKE 1 TABLET BY MOUTH EVERY DAY 05/07/20   Christen Butter, NP  gentamicin (GARAMYCIN) 0.3 % ophthalmic solution Place 2 drops into the right eye 4 (four) times daily for 7 days. 08/24/20 08/31/20  Lurene Shadow, PA-C  ibuprofen (ADVIL,MOTRIN) 800 MG tablet Take 800 mg by mouth every 8 (eight) hours as needed.    [provider]  melatonin 1 MG TABS tablet Take 3 mg by mouth at bedtime.    [provider]  omeprazole (PRILOSEC) 40 MG capsule Take 1 capsule (40 mg total) by mouth daily. 05/09/20   Christen Butter, NP  Oxcarbazepine (TRILEPTAL) 300 MG tablet Take 1 tablet (300 mg total) by mouth 2 (two) times daily. 05/03/20   Thresa Ross, MD  perphenazine (TRILAFON) 4 MG tablet Take 1 tablet (4 mg total) by mouth in the morning and at bedtime. Take 1 tablet by mouth in the morning and 2 at bedtime 05/28/20 05/28/21  Thresa Ross, MD  venlafaxine XR (EFFEXOR-XR) 37.5 MG 24 hr capsule Take 3 capsules (112.5 mg total) by mouth daily with breakfast. 05/28/20   Thresa Ross, MD  clonazePAM Scarlette Calico) 0.5  MG tablet Take 0.25 mg by mouth daily.  10/31/19  [provider]  propranolol (INDERAL) 10 MG tablet Take 5 mg by mouth daily.  10/31/19  [provider]  sertraline (ZOLOFT) 100 MG tablet Take 100 mg by mouth daily.  10/31/19  [provider]    Family History Family History  Problem Relation Age of Onset  . Depression Mother   . Anxiety disorder Mother   . Seizures Mother   . Breast cancer Mother   . Dementia Mother   . Cancer Mother        breast  . Schizophrenia Father   . Stroke Maternal Grandmother     Social History Social History   Tobacco Use  . Smoking status: Never Smoker  . Smokeless tobacco: Never Used  Vaping Use  . Vaping Use: Never used  Substance Use Topics  . Alcohol use:  Yes    Comment: Rarely  . Drug use: No     Allergies   Aripiprazole, Codeine, Doxycycline, Latuda [lurasidone hcl], Lurasidone, Penicillins, Pseudoeph-bromphen-dm, Resperal-dm [pseudoeph-bromphen-dm], Risperidone and related, Sulfa antibiotics, and Topamax [topiramate]   Review of Systems Review of Systems  Constitutional: Negative for chills and fever.  HENT: Negative for congestion.   Eyes: Positive for pain, discharge, redness and itching. Negative for photophobia and visual disturbance.  Neurological: Negative for dizziness and headaches.     Physical Exam Triage Vital Signs ED Triage Vitals  Enc Vitals Group     BP 08/24/20 1623 119/83     Pulse Rate 08/24/20 1623 89     Resp 08/24/20 1623 15     Temp 08/24/20 1623 98.9 F (37.2 C)     Temp Source 08/24/20 1623 Oral     SpO2 08/24/20 1623 98 %     Weight --      Height --      Head Circumference --      Peak Flow --      Pain Score 08/24/20 1620 6     Pain Loc --      Pain Edu? --      Excl. in GC? --    No data found.  Updated Vital Signs BP 119/83 (BP Location: Right Arm)   Pulse 89   Temp 98.9 F (37.2 C) (Oral)   Resp 15   SpO2 98%   Visual Acuity Right Eye Distance: 20/20 Left Eye Distance: 20/20 Bilateral Distance: 20/15  Right Eye Near:   Left Eye Near:    Bilateral Near:     Physical Exam Vitals and nursing note reviewed.  Constitutional:      Appearance: Normal appearance. She is well-developed.  HENT:     Head: Normocephalic and atraumatic.     Right Ear: Tympanic membrane and ear canal normal.     Left Ear: Tympanic membrane and ear canal normal.     Nose: Nose normal.     Mouth/Throat:     Mouth: Mucous membranes are moist.     Pharynx: Oropharynx is clear.  Eyes:     General: Lids are normal. Lids are everted, no foreign bodies appreciated. Vision grossly intact.        Right eye: Discharge present. No foreign body.     Extraocular Movements: Extraocular movements intact.      Conjunctiva/sclera: Conjunctivae normal.     Pupils: Pupils are equal, round, and reactive to light.      Comments: No fluorescein uptake.   Cardiovascular:     Rate  and Rhythm: Normal rate.  Pulmonary:     Effort: Pulmonary effort is normal.  Musculoskeletal:        General: Normal range of motion.     Cervical back: Normal range of motion and neck supple.  Lymphadenopathy:     Cervical: No cervical adenopathy.  Skin:    General: Skin is warm and dry.  Neurological:     Mental Status: She is alert and oriented to person, place, and time.  Psychiatric:        Behavior: Behavior normal.      UC Treatments / Results  Labs (all labs ordered are listed, but only abnormal results are displayed) Labs Reviewed - No data to display  EKG   Radiology No results found.  Procedures Procedures (including critical care time)  Medications Ordered in UC Medications - No data to display  Initial Impression / Assessment and Plan / UC Course  I have reviewed the triage vital signs and the nursing notes.  Pertinent labs & imaging results that were available during my care of the patient were reviewed by me and considered in my medical decision making (see chart for details).     Hx and exam c/w bacterial conjunctivitis without evidence of corneal abrasion or ulceration.  No evidence of cellulitis No foreign bodies seen Rx: gentamicin ophthalmic drops Advised to not wear contacts until symptoms resolve, start with fresh pair of contacts once resolved F/u with optometrist next week for change in lenses  AVS given  Final Clinical Impressions(s) / UC Diagnoses   Final diagnoses:  Bacterial conjunctivitis of right eye     Discharge Instructions      Avoid wearing your contacts until the infection has cleared, then use a new pair of contacts.  Call Monday to schedule a follow up exam with your eye specialist next week for recheck of symptoms and to discuss changing the type of  contact lenses you have been wearing.     ED Prescriptions    Medication Sig Dispense Auth. Provider   gentamicin (GARAMYCIN) 0.3 % ophthalmic solution Place 2 drops into the right eye 4 (four) times daily for 7 days. 5 mL Lurene Shadow, PA-C     PDMP not reviewed this encounter.   Lurene Shadow, [place] 08/24/20 1858

## 2020-08-24 NOTE — Discharge Instructions (Signed)
  Avoid wearing your contacts until the infection has cleared, then use a new pair of contacts.  Call Monday to schedule a follow up exam with your eye specialist next week for recheck of symptoms and to discuss changing the type of contact lenses you have been wearing.

## 2020-08-27 ENCOUNTER — Ambulatory Visit (INDEPENDENT_AMBULATORY_CARE_PROVIDER_SITE_OTHER): Payer: Medicare Other | Admitting: Licensed Clinical Social Worker

## 2020-08-27 DIAGNOSIS — F411 Generalized anxiety disorder: Secondary | ICD-10-CM

## 2020-08-27 DIAGNOSIS — F251 Schizoaffective disorder, depressive type: Secondary | ICD-10-CM | POA: Diagnosis not present

## 2020-08-27 DIAGNOSIS — F5102 Adjustment insomnia: Secondary | ICD-10-CM

## 2020-08-27 NOTE — Progress Notes (Signed)
Virtual Visit via Telephone Note  Therapist-home office Patient-care I connected with Shannon Boyd on 08/27/20 at  2:00 PM EDT by telephone and verified that I am speaking with the correct person using two identifiers.   I discussed the limitations, risks, security and privacy concerns of performing an evaluation and management service by telephone and the availability of in person appointments. I also discussed with the patient that there may be a patient responsible charge related to this service. The patient expressed understanding and agreed to proceed.   I discussed the assessment and treatment plan with the patient. The patient was provided an opportunity to ask questions and all were answered. The patient agreed with the plan and demonstrated an understanding of the instructions.   The patient was advised to call back or seek an in-person evaluation if the symptoms worsen or if the condition fails to improve as anticipated.  I provided 40 minutes of non-face-to-face time during this encounter.  THERAPIST PROGRESS NOTE  Session Time: 1:00 PM to 1:40 PM  Participation Level: Active  Behavioral Response: CasualAlertAnxious and Depressed  Type of Therapy: Individual Therapy  Treatment Goals addressed:  maintain stability with mood, learn and apply effective strategies to manage mood, coping  Interventions: CBT, Solution Focused, Strength-based, Supportive, Reframing and Other: coping  Summary: Shannon Boyd is a 34 y.o. femalewho presents with good overall a little bit of trouble last week with depression, forget to take medication for two days. Still sad moments but nearly at rough as that. Still situation because she feels sad about love life that she doesn't have. Started attending a new church a month ago. There was a guy there was interested in and he wasn't showing signals he was interested and depressed over that. Shares her feeling that she has been single for so long and  something wrong with her. Doesn't understand why. Therapist challenged her on this perspective noting other ways of looking at her situation. Being busier is good for her. Infection end of last week missing Friday and Saturday and got a migraine put her off but she also realizes that those things happen. 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 Therapist pointed this out as an achievement. Ended up visiting a new church. It had to do with her fears of interacting with guy interested in. Also driving issues, concerns about backing up. Feels she has anxiety to work on. Therapist reviewed coping for anxiety and patient shared will need to ask herself Is this the worst possible case scenario and can I cope? New experience of backing up. Avoid. Related if she was a friend talking to herself would say more experience you get more comfortable you will be. Tell herself I should practice or go slowly.  In reflection patient does not think she handled leaving the church well and therapist pointed out again not to be hard on herself was learning and growth can come from it.  In reviewing treatment plan patient pointed out support she got from responsible to manage her depression     Suicidal/Homicidal: No  Therapist Response: Therapist reviewed symptoms, facilitated expression of thoughts and feelings, reviewed treatment plan and patient gave consent to complete by phone.  Patient identified specifically issues with depression as reason she needs to continue to work on things.  Patient added anxiety and reviewed coping strategies for anxiety including that often it is Product manager flight, we think things are dangerous that or not.  Helpful to slow down her reaction  so prefrontal cortex can be involved and reflect on things more accurately.  Encourage patient to notice anxious thoughts such as I am in danger, catastrophizing, underestimating her ability to cope.  Reviewed avoidance makes anxiety worse do not  really situation is bad as she thought, does not give herself a chance to work through anxiety and sees she is all right does not help her develop coping skills.  Courage patient with a compassionate relationship with self and noted when patient did that she gave herself good advice.  Therapist used active listening open questions supportive interventions.  Therapist utilize reframing to address depressive symptoms pointing out relationship can happen for patient also putting herself in positions where she has a chance to meet more people, good she is working now.  Plan: Return again in 4 weeks.2.  Work on anxiety, depression, coping  Diagnosis: Axis I: schizoaffective disorder, depressive type, generalized anxiety disorder, adjustment insomnia      Axis II: No diagnosis    Coolidge Breeze, LCSW 08/27/2020

## 2020-08-30 ENCOUNTER — Other Ambulatory Visit (HOSPITAL_COMMUNITY): Payer: Self-pay

## 2020-08-30 ENCOUNTER — Encounter (HOSPITAL_COMMUNITY): Payer: Self-pay | Admitting: Psychiatry

## 2020-08-30 ENCOUNTER — Telehealth (INDEPENDENT_AMBULATORY_CARE_PROVIDER_SITE_OTHER): Payer: Medicare Other | Admitting: Psychiatry

## 2020-08-30 DIAGNOSIS — F5102 Adjustment insomnia: Secondary | ICD-10-CM | POA: Diagnosis not present

## 2020-08-30 DIAGNOSIS — F251 Schizoaffective disorder, depressive type: Secondary | ICD-10-CM

## 2020-08-30 DIAGNOSIS — F411 Generalized anxiety disorder: Secondary | ICD-10-CM | POA: Diagnosis not present

## 2020-08-30 MED ORDER — VENLAFAXINE HCL ER 37.5 MG PO CP24: 112.5000 mg | ORAL_CAPSULE | Freq: Every day | ORAL | 0 refills | Status: DC

## 2020-08-30 MED ORDER — PERPHENAZINE 4 MG PO TABS: 4.0000 mg | ORAL_TABLET | Freq: Two times a day (BID) | ORAL | 0 refills | Status: DC

## 2020-08-30 MED ORDER — BENZTROPINE MESYLATE 0.5 MG PO TABS: 0.5000 mg | ORAL_TABLET | Freq: Two times a day (BID) | ORAL | 1 refills | Status: DC

## 2020-08-30 MED ORDER — OXCARBAZEPINE 300 MG PO TABS: 300.0000 mg | ORAL_TABLET | Freq: Two times a day (BID) | ORAL | 1 refills | Status: DC

## 2020-08-30 NOTE — Progress Notes (Signed)
[facility] Follow up visit  Patient Identification: Shannon Boyd MRN:  [id number] Date of Evaluation:  08/30/2020 Referral Source: primary care. Also by our Therapist [facility] Chief Complaint:   follow up med review Visit Diagnosis:    ICD-10-CM   1. Schizoaffective disorder, depressive type (HCC)  F25.1   2. GAD (generalized anxiety disorder)  F41.1   3. Adjustment insomnia  F51.02       I connected with Sumayah Simington on 05/28/20 at  3:00 PM EDT by a video enabled telemedicine application and verified that I am speaking with the correct person using two identifiers.  I discussed the limitations of evaluation and management by telemedicine and the availability of in person appointments. The patient expressed understanding and agreed to proceed.  Patient location: home Provider location : home office  History of Present Illness: Patient is a 34 years old currently single Caucasian female is living with her mom she is currently on disability for schizophrenia, obesity and other diagnoses.  She wanted to move her services from [facility] as she is not having regular appointments or difficulty at times feeling of medications  Brief history " She has depression episodes of withdrawn apathy decreased energy, withdrawn, hopeless with numerous suicidal attempts or ideations leading to hospitalizations. Last was in 2017 '  Doing fair on meds Feels balanced. Less tactile hallucinations Some dry mouth  Denies tremors She is attending a support group for depression that helps and therapy   Aggravating factor: difficult childhood and dealing with mom. lonliness Modifying factor: housing Duration since young age Drug use : denies  Past psych meds: Latuda, risperdal. abilify . Had side effects   Past Psychiatric History: schizoaffective disorder  Previous Psychotropic Medications: Yes   Substance Abuse History in the last 12 months:  No.  Consequences of Substance Abuse: NA  Past Medical  History:  Past Medical History:  Diagnosis Date  . Anxiety   . Asthma   . Depression   . History of fracture of toe   . History of sprained ankle   . Hypertension   . Schizoaffective disorder [facility])     Past Surgical History:  Procedure Laterality Date  . ADENOIDECTOMY    . SALPINGECTOMY Bilateral   . WISDOM TOOTH EXTRACTION      Family Psychiatric History: Mom told patient . Her dad had schizophrenia  Family History:  Family History  Problem Relation Age of Onset  . Depression Mother   . Anxiety disorder Mother   . Seizures Mother   . Breast cancer Mother   . Dementia Mother   . Cancer Mother        breast  . Schizophrenia Father   . Stroke Maternal Grandmother     Social History:   Social History   Socioeconomic History  . Marital status: Single    Spouse name: Not on file  . Number of children: Not on file  . Years of education: Not on file  . Highest education level: Not on file  Occupational History  . Not on file  Tobacco Use  . Smoking status: Never Smoker  . Smokeless tobacco: Never Used  Vaping Use  . Vaping Use: Never used  Substance and Sexual Activity  . Alcohol use: Yes    Comment: Rarely  . Drug use: No  . Sexual activity: Never  Other Topics Concern  . Not on file  Social History Narrative  . Not on file   Social Determinants of Health   Financial Resource Strain:   .  Difficulty of Paying Living Expenses: Not on file  Food Insecurity:   . Worried About Programme researcher, broadcasting/film/video in the Last Year: Not on file  . Ran Out of Food in the Last Year: Not on file  Transportation Needs:   . Lack of Transportation (Medical): Not on file  . Lack of Transportation (Non-Medical): Not on file  Physical Activity:   . Days of Exercise per Week: Not on file  . Minutes of Exercise per Session: Not on file  Stress:   . Feeling of Stress : Not on file  Social Connections:   . Frequency of Communication with Friends and Family: Not on file  . Frequency  of Social Gatherings with Friends and Family: Not on file  . Attends Religious Services: Not on file  . Active Member of Clubs or Organizations: Not on file  . Attends Banker Meetings: Not on file  . Marital Status: Not on file       Allergies:   Allergies  Allergen Reactions  . Aripiprazole Swelling  . Codeine   . Doxycycline   . Latuda [Lurasidone Hcl]   . Lurasidone Nausea And Vomiting    Reaction unknown Reaction unknown   . Penicillins   . Pseudoeph-Bromphen-Dm     Other reaction(s): Unknown  . Resperal-Dm [Pseudoeph-Bromphen-Dm]   . Risperidone And Related Hives  . Sulfa Antibiotics Other (See Comments)    Pt can't remember  . Topamax [Topiramate]     Metabolic Disorder Labs: No results found for: HGBA1C, MPG No results found for: PROLACTIN No results found for: CHOL, TRIG, HDL, CHOLHDL, VLDL, LDLCALC No results found for: TSH  Therapeutic Level Labs: No results found for: LITHIUM No results found for: CBMZ No results found for: VALPROATE  Current Medications: Current Outpatient Medications  Medication Sig Dispense Refill  . benztropine (COGENTIN) 0.5 MG tablet Take 1 tablet (0.5 mg total) by mouth 2 (two) times daily. 60 tablet 1  . Cholecalciferol (VITAMIN D3) 50 MCG (2000 UT) capsule Take 2,000 Units by mouth daily.    . famotidine (PEPCID) 20 MG tablet TAKE 1 TABLET BY MOUTH EVERY DAY 90 tablet 1  . gentamicin (GARAMYCIN) 0.3 % ophthalmic solution Place 2 drops into the right eye 4 (four) times daily for 7 days. 5 mL 0  . ibuprofen (ADVIL,MOTRIN) 800 MG tablet Take 800 mg by mouth every 8 (eight) hours as needed.    . melatonin 1 MG TABS tablet Take 3 mg by mouth at bedtime.    Marland Kitchen omeprazole (PRILOSEC) 40 MG capsule Take 1 capsule (40 mg total) by mouth daily. 90 capsule 1  . Oxcarbazepine (TRILEPTAL) 300 MG tablet Take 1 tablet (300 mg total) by mouth 2 (two) times daily. 60 tablet 1  . perphenazine (TRILAFON) 4 MG tablet Take 1 tablet (4  mg total) by mouth in the morning and at bedtime. Take 1 tablet by mouth in the morning and 2 at bedtime 270 tablet 0  . venlafaxine XR (EFFEXOR-XR) 37.5 MG 24 hr capsule Take 3 capsules (112.5 mg total) by mouth daily with breakfast. 270 capsule 0   No current facility-administered medications for this visit.       Psychiatric Specialty Exam: Review of Systems  Cardiovascular: Negative for chest pain.  Skin: Negative for rash.  Psychiatric/Behavioral: Negative for substance abuse and suicidal ideas.    There were no vitals taken for this visit.There is no height or weight on file to calculate BMI.  General Appearance: Casual  Eye Contact:  Fair  Speech:  Normal Rate  Volume:  Decreased  Mood:fair  Affect:  Constricted  Thought Process:  Goal Directed  Orientation:  Full (Time, Place, and Person)  Thought Content:  Rumination  Suicidal Thoughts:  No  Homicidal Thoughts:  No  Memory:  Immediate;   Fair Recent;   Fair  Judgement:  Fair  Insight:  Shallow  Psychomotor Activity:  Normal  Concentration:  Concentration: Fair and Attention Span: Fair  Recall:  [employer] of Knowledge:Fair  Language: Fair  Akathisia:  No  Handed:  Right  AIMS (if indicated):  No involuntary movements  Assets:  Desire for Improvement Financial Resources/Insurance Leisure Time Physical Health  ADL's:  Intact  Cognition: WNL  Sleep:  Fair   Screenings: GAD-7     Office Visit from 04/16/2020 in [facility]  Total GAD-7 Score 5    PHQ2-9     Office Visit from 04/16/2020 in [facility]  PHQ-2 Total Score 2  PHQ-9 Total Score 9      Assessment and Plan: as follows Schizoaffective disorder, depressed type: doing fair continue trileptal perphenazine  GAD: manageable on effexor. Says if miss dose, feels anxious or subdued  Past history of OD on benzo, will avoid and she is working on Pharmacologist and attends an  Emotionally annonymous group and has sponsor to call Insomnia; not worse, continue sleep hygiene I discussed the assessment and treatment plan with the patient. The patient was provided an opportunity to ask questions and all were answered. The patient agreed with the plan and demonstrated an understanding of the instructions.   The patient was advised to call back or seek an in-person evaluation if the symptoms worsen or if the condition fails to improve as anticipated. Non face to face time spent: .  Fu 3 m. meds refilled   Thresa Ross, MD 9/30/20213:13 PM

## 2020-09-06 ENCOUNTER — Telehealth (HOSPITAL_COMMUNITY): Payer: Self-pay | Admitting: Psychiatry

## 2020-09-06 NOTE — Telephone Encounter (Signed)
Ok sent for signature scan back

## 2020-09-06 NOTE — Telephone Encounter (Signed)
Pt is requesting a letter allowing her to have water or have water breaks so often.  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  With her meds that dr Gilmore Laroche has her on, it causes dry mouth and she has been told by dr Gilmore Laroche to keep water by her side.   Please advise.

## 2020-09-10 ENCOUNTER — Encounter: Payer: Self-pay | Admitting: Medical-Surgical

## 2020-09-10 ENCOUNTER — Ambulatory Visit (INDEPENDENT_AMBULATORY_CARE_PROVIDER_SITE_OTHER): Payer: Medicare Other | Admitting: Medical-Surgical

## 2020-09-10 VITALS — BP 126/83 | HR 93 | Temp 98.3°F | Ht 65.0 in | Wt 218.9 lb

## 2020-09-10 DIAGNOSIS — R1012 Left upper quadrant pain: Secondary | ICD-10-CM | POA: Diagnosis not present

## 2020-09-10 DIAGNOSIS — K219 Gastro-esophageal reflux disease without esophagitis: Secondary | ICD-10-CM

## 2020-09-10 NOTE — Progress Notes (Signed)
Subjective:    CC: stomach issues  HPI: Pleasant 34 year old female presenting today for evaluation and discussion of her chronic stomach issues.  She was seen by me in May of this year for GERD and continued stomach issues.  She has been taking omeprazole 40 mg daily in the morning and famotidine 20 mg every afternoon.  Notes that her reflux has been fairly well controlled lately but she continues to have abdominal pain around 2-3 PM most afternoons.  Pain is located in the left upper quadrant last approximately 5 minutes.  She treats the pain by eating something or taking the Pepcid dose if she has not already.  Notes this does tend to resolve the pain.  She is having irregular bowel movements with some days having 4-5 stools and others having none.  Stool is soft, brown with no visible blood or melena.  Denies nausea and vomiting.  Notes that her caffeine intake had increased recently but she quit her job last week so she is no longer drinking coffee like she was.  She does endorse eating protein bar that is chocolate or mint chocolate chip flavored.  She is working with a Health and safety inspector and has lost quite a bit of weight since May.  She is eating small amounts every 3-4 hours but occasionally does have 1 big meal.  She is tracking her intake which is about 1800 to 1900 cal/day.  She was evaluated by GI several years ago but has not seen them since.  I reviewed the past medical history, family history, social history, surgical history, and allergies today and no changes were needed.  Please see the problem list section below in epic for further details.  Past Medical History: Past Medical History:  Diagnosis Date  . Anxiety   . Asthma   . Depression   . History of fracture of toe   . History of sprained ankle   . Hypertension   . Schizoaffective disorder [facility])    Past Surgical History: Past Surgical History:  Procedure Laterality Date  . ADENOIDECTOMY    . SALPINGECTOMY Bilateral   .  WISDOM TOOTH EXTRACTION     Social History: Social History   Socioeconomic History  . Marital status: Single    Spouse name: Not on file  . Number of children: Not on file  . Years of education: Not on file  . Highest education level: Not on file  Occupational History  . Not on file  Tobacco Use  . Smoking status: Never Smoker  . Smokeless tobacco: Never Used  Vaping Use  . Vaping Use: Never used  Substance and Sexual Activity  . Alcohol use: Yes    Comment: Rarely  . Drug use: No  . Sexual activity: Never  Other Topics Concern  . Not on file  Social History Narrative  . Not on file   Social Determinants of Health   Financial Resource Strain:   . Difficulty of Paying Living Expenses: Not on file  Food Insecurity:   . Worried About Programme researcher, broadcasting/film/video in the Last Year: Not on file  . Ran Out of Food in the Last Year: Not on file  Transportation Needs:   . Lack of Transportation (Medical): Not on file  . Lack of Transportation (Non-Medical): Not on file  Physical Activity:   . Days of Exercise per Week: Not on file  . Minutes of Exercise per Session: Not on file  Stress:   . Feeling of Stress : Not on  file  Social Connections:   . Frequency of Communication with Friends and Family: Not on file  . Frequency of Social Gatherings with Friends and Family: Not on file  . Attends Religious Services: Not on file  . Active Member of Clubs or Organizations: Not on file  . Attends Banker Meetings: Not on file  . Marital Status: Not on file   Family History: Family History  Problem Relation Age of Onset  . Depression Mother   . Anxiety disorder Mother   . Seizures Mother   . Breast cancer Mother   . Dementia Mother   . Cancer Mother        breast  . Schizophrenia Father   . Stroke Maternal Grandmother    Allergies: Allergies  Allergen Reactions  . Aripiprazole Swelling  . Codeine   . Doxycycline Rash  . Latuda [Lurasidone Hcl]   . Lurasidone  Nausea And Vomiting       . Penicillins   . Pseudoeph-Bromphen-Dm     Other reaction(s): Unknown  . Resperal-Dm [Pseudoeph-Bromphen-Dm] Hives  . Risperidone And Related Hives  . Sulfa Antibiotics Other (See Comments)    Pt can't remember  . Topamax [Topiramate]    Medications: See med rec.  Review of Systems: See HPI for pertinent positives and negatives.   Objective:    General: Well Developed, well nourished, and in no acute distress.  Neuro: Alert and oriented x3.  HEENT: Normocephalic, atraumatic.  Skin: Warm and dry. Cardiac: Regular rate and rhythm, no murmurs rubs or gallops, no lower extremity edema.  Respiratory: Clear to auscultation bilaterally. Not using accessory muscles, speaking in full sentences. Abdomen: Soft, nondistended.  Mild tenderness to left upper quadrant. Bowel sounds + x 4 quadrants. No HSM appreciated.  Impression and Recommendations:    1. Gastroesophageal reflux disease without esophagitis Continue omeprazole 40 mg daily and famotidine 20 mg every afternoon.  Avoid foods and activities that worsen reflux. - Ambulatory referral to Gastroenterology  2. LUQ abdominal pain Unclear etiology.  Consider possible gastric ulcer.  Referring to GI for further investigation.   - Ambulatory referral to Gastroenterology  Return in about 6 months (around 03/11/2021) for general follow up or sooner if needed. ___________________________________________ Thayer Ohm, DNP, APRN, FNP-BC Primary Care and Sports Medicine [facility] [place]

## 2020-10-10 ENCOUNTER — Ambulatory Visit (INDEPENDENT_AMBULATORY_CARE_PROVIDER_SITE_OTHER): Payer: Medicare Other | Admitting: Licensed Clinical Social Worker

## 2020-10-10 DIAGNOSIS — F411 Generalized anxiety disorder: Secondary | ICD-10-CM

## 2020-10-10 DIAGNOSIS — F251 Schizoaffective disorder, depressive type: Secondary | ICD-10-CM

## 2020-10-10 DIAGNOSIS — F5102 Adjustment insomnia: Secondary | ICD-10-CM | POA: Diagnosis not present

## 2020-10-10 NOTE — Progress Notes (Signed)
Virtual Visit via Telephone Note  I connected with Shannon Boyd on 10/10/20 at  9:00 AM EST by telephone and verified that I am speaking with the correct person using two identifiers.  Location: Patient: home Provider: home office   I discussed the limitations, risks, security and privacy concerns of performing an evaluation and management service by telephone and the availability of in person appointments. I also discussed with the patient that there may be a patient responsible charge related to this service. The patient expressed understanding and agreed to proceed.   I discussed the assessment and treatment plan with the patient. The patient was provided an opportunity to ask questions and all were answered. The patient agreed with the plan and demonstrated an understanding of the instructions.   The patient was advised to call back or seek an in-person evaluation if the symptoms worsen or if the condition fails to improve as anticipated.  I provided 45 minutes of non-face-to-face time during this encounter.   THERAPIST PROGRESS NOTE  Session Time: 9:00 AM to 9:45 AM  Participation Level: Active  Behavioral Response: CasualAlertDysphoric and Euthymic  Type of Therapy: Individual Therapy  Treatment Goals addressed: maintain stability with mood, learn and apply effective strategies to manage mood, coping   Interventions: Solution Focused, Strength-based, Supportive, Reframing and Other: coping  Summary: Shannon Boyd is a 34 y.o. female who presents with decided to leave job because of change of management and policies she was not prepared for that and realized it was detrimental to health. Found herself not feeling well and dizzy with not allowing her to drink water and said it was not going to work for her. Patient admits a little passive/aggressive but glad left. At this point glad she doesn't have work knows it will be bad around the holidays. Applied for a couple of positions  on [employer] but hasn't done a lot, being more selective about jobs. Applied for [employer] position and had been in this system mid-pandemic. Patient is excited about it. It is a step up from where she was working. At first was depressed about leaving work it but now has gotten used to it. Mom also starting a new medication and glad be there to monitor and help her out. Mom has seizures. 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 Why getting part-time jobs. Still basically without relationship and that hasn't changed and have to not dwell on it or will get her depressed. Added to tattoo and reminded therapist has a semi-colon that symbolizes a suicide survivor. Has a heart semi-colon and butterfly represent certain things when get depressed. Heart represents that God loves her, semi-colon God is not done with her story, wings hope in the future and ultimately in the right direction to be Jesus. Wants to be there as physical reminder that somebody loves her and meant to be here. Visual reminder that things not always the same and God has purpose in everything. Butterfly reminds her of change and renewal. Celebrate recovery chips on key chain remind her of character defects have to work on and have progressed so far. Initial "giving it up", 30 days and 6 months. A nice reminder. First realized that if meant to die it would have happened and god will take her when ready. Her trying to end her life hasn't happened so should be obvious will happen when meant to happen. Thinks die when meant to do what meant to do. Something to be  said about bucket list things want to do before die. Wants to pay off student loan and wants to travel. Would like to go to [place]. Has traveled on her own and enjoyed it. Reviewed session and patient said she reminded therapist of tattoo and that she had it worked on and good to talk about the hopeful things to look forward to, positive things that can experience in life.        Suicidal/Homicidal: No  Therapist Response: Reviewed symptoms, facilitated expression of thoughts and feelings.  Discussed coping strategies patient using that can be helpful first distraction to disrupt the pattern of negative thinking and therapist collaborated that the longer you spend time in a negative pattern of thinking the deeper into it you can get.  Therapist added that distraction is one of the strategies can use and added getting out of your head by shifting gears to another modality of experience in general is helpful such as bodily activity, expressing emotions interpersonal communication, sensory distraction and related sometimes takes in a delivered active will.  Also discussed visual reminders as helpful such as patient's tattoo very impactful to see as a tattoo on her body.  Did patient on focusing on positive experiences possible in her life, insight from our difficult experiences can make [place] understand life with more purpose and meaning.  Therapist utilized this intervention as a strategy to empower patient, helpful for patient to talk about experiences that she created for herself to remind herself of those possibilities.  Therapist provided active listening, open questions supportive interventions  Plan: Return again in 4 weeks.  Diagnosis: Axis I: schizoaffective disorder, depressive type, generalized anxiety disorder, adjustment insomnia      Axis II: No diagnosis    Coolidge Breeze, LCSW 10/10/2020

## 2020-10-29 ENCOUNTER — Other Ambulatory Visit (HOSPITAL_COMMUNITY): Payer: Self-pay | Admitting: Psychiatry

## 2020-11-02 ENCOUNTER — Other Ambulatory Visit: Payer: Self-pay | Admitting: Medical-Surgical

## 2020-11-02 DIAGNOSIS — R196 Halitosis: Secondary | ICD-10-CM

## 2020-11-02 DIAGNOSIS — K219 Gastro-esophageal reflux disease without esophagitis: Secondary | ICD-10-CM

## 2020-11-08 ENCOUNTER — Ambulatory Visit (INDEPENDENT_AMBULATORY_CARE_PROVIDER_SITE_OTHER): Payer: Medicare Other | Admitting: Licensed Clinical Social Worker

## 2020-11-08 DIAGNOSIS — F251 Schizoaffective disorder, depressive type: Secondary | ICD-10-CM | POA: Diagnosis not present

## 2020-11-08 DIAGNOSIS — F411 Generalized anxiety disorder: Secondary | ICD-10-CM

## 2020-11-08 DIAGNOSIS — F5102 Adjustment insomnia: Secondary | ICD-10-CM | POA: Diagnosis not present

## 2020-11-08 NOTE — Progress Notes (Signed)
Virtual Visit via Telephone Note  I connected with Shannon Boyd on 11/08/20 at 11:00 AM EST by telephone and verified that I am speaking with the correct person using two identifiers.  Location: Patient: home Provider: home office   I discussed the limitations, risks, security and privacy concerns of performing an evaluation and management service by telephone and the availability of in person appointments. I also discussed with the patient that there may be a patient responsible charge related to this service. The patient expressed understanding and agreed to proceed.   I discussed the assessment and treatment plan with the patient. The patient was provided an opportunity to ask questions and all were answered. The patient agreed with the plan and demonstrated an understanding of the instructions.   The patient was advised to call back or seek an in-person evaluation if the symptoms worsen or if the condition fails to improve as anticipated.  I provided 53 minutes of non-face-to-face time during this encounter.   THERAPIST PROGRESS NOTE  Session Time: 11:00 AM to 11:53 AM  Participation Level: Active  Behavioral Response: CasualAlertappropriate  Type of Therapy: Individual Therapy  Treatment Goals addressed:  maintain stability with mood, learn and apply effective strategies to manage mood, coping  Interventions: Solution Focused, Strength-based, Supportive, Reframing and Other: coping  Summary: Shannon Boyd is a 34 y.o. female who presents with doing ok. Interview/tour of a school in [place]. Liked it but wanted more pay than they willing to give. Passing on the opportunity. Part-time Film/video editor position. Decided to pass even though exciting to decorate a classroom and teach. Money is tight but to be expected. Still trying to adjust. Sad because close to dream job but not at pay willing to take.  Discussed would be worse to be locked into not getting paid enough and doing  all the work.  Still looking for work. Everyday not that much time on it. Not worth investing her too much time in it. Thankful for holidays helping her to keep busy. Working on Christmas cards to sent to cards, Christmas shopping for friends, attending holiday activities, and running errands with mom to keep busy. Right now focusing on greeting cards, church had an event patient showed her art work and one thing sold. Attending a church off of [address]. Left [employer] after being there for a long time, left because didn't like country music, worship leader liked for several years he left, new one come in who liked country, liked church and worship with music so bothered her so much left. Patient shares that music affects you mentally, "what you put into yourself really impacts how you feel." Realized when came to [facility] realize some of the things not good for her, mostly listens to [place] music, [employer] start up church disagreement with pastor, so now at [employer]. Reviewed changing physical state impacts mental health and patinet shares that would mean her walks more important than just for her weight. She has had success losing weight and now at a BMI where can have chest reduction surgery. Went to Careers adviser last month up to insurance to approve. Losing weight but may be at a plateau. Patient shares for her it is about reaching goals. Realized have addiction tendency with chocolate and eat it at least once a day. "At least I am trying to be healthy with it." Try and make sure healthiest with it can can't see stopping. Checked in with mood and shares what she is thinking about  when she is thinking about things know can't change, sad, focusing on what she has tried to change also causes her to be sad. Focusing on good things helps. Discussed patient's insight about addiction and why it can be hard to stop. The chemical changes the brain and you feel better why can't give up chocolate body and  brain get used to it. Feels awful when don't have substance, it is hard and people struggle. She recognizes this because of her own problem with food. She works on it daily, life long thing and doesn't always work, one day at time, daily process same with God turned over every day. A day is a good goal. Scripture each day a new chance. Discussed devotional "Unafraid" [place] Journey patient has and how it provided guidance for her at a time when had anxiety. .      Suicidal/Homicidal: No  Therapist Response: Therapist reviewed symptoms, facilitated expression of thoughts and feelings and processing insight developed by patient that was reinforced by therapist that mood impacted by her thoughts and what we focus on and helpful in session to focus on some of the positive developments for patient.  Therapist shared her impression of patient's ability to identify and work on goals as a strength for her, noting approach of goal oriented can be effective in reaching goals, noted patient's accomplishments and reaching goals as well.  Noted patient's approach of taking things day by day as helpful.  In terms of what we focus on noting recognizing when refocusing on things we cannot control can be unhelpful to refocus on things in the present, say a positive mantra can be more helpful and productive.  This patient's feelings around stressors and worked on Optician, dispensing.  Discussed helpfulness of scripture providing advice and guiding [place] with life, helpful in managing mental health symptoms as well.  Therapist provided education on mood management that emotions are actually felt to experiences so if you change your physical experience you can change your mood tied this in with helpfulness of patient walking both for her health and her mood.  Noted aspects of looking at job search for patient has criteria gives her more sense of control that can be helpful in the process.  Assess helpful patient engaged in activities to  keep her busy during the season.  Therapist provided active listening, open questions supportive interventions  Plan: Return again in 6 weeks.(therapist next opening) 2.Work on coping, mood stability  Diagnosis: Axis I: schizoaffective disorder, depressive type, generalized anxiety disorder, adjustment insomnia      Axis II: No diagnosis    Coolidge Breeze, LCSW 11/08/2020

## 2020-12-04 ENCOUNTER — Other Ambulatory Visit (HOSPITAL_COMMUNITY): Payer: Self-pay | Admitting: Psychiatry

## 2020-12-04 ENCOUNTER — Other Ambulatory Visit (HOSPITAL_COMMUNITY): Payer: Self-pay

## 2020-12-04 MED ORDER — BENZTROPINE MESYLATE 0.5 MG PO TABS: 0.5000 mg | ORAL_TABLET | Freq: Two times a day (BID) | ORAL | 0 refills | Status: DC

## 2020-12-05 DIAGNOSIS — Z6835 Body mass index (BMI) 35.0-35.9, adult: Secondary | ICD-10-CM | POA: Diagnosis not present

## 2020-12-05 DIAGNOSIS — E669 Obesity, unspecified: Secondary | ICD-10-CM | POA: Diagnosis not present

## 2020-12-05 DIAGNOSIS — K219 Gastro-esophageal reflux disease without esophagitis: Secondary | ICD-10-CM | POA: Diagnosis not present

## 2020-12-06 ENCOUNTER — Telehealth (INDEPENDENT_AMBULATORY_CARE_PROVIDER_SITE_OTHER): Payer: Medicare HMO | Admitting: Psychiatry

## 2020-12-06 ENCOUNTER — Encounter (HOSPITAL_COMMUNITY): Payer: Self-pay | Admitting: Psychiatry

## 2020-12-06 DIAGNOSIS — F251 Schizoaffective disorder, depressive type: Secondary | ICD-10-CM | POA: Diagnosis not present

## 2020-12-06 DIAGNOSIS — F5102 Adjustment insomnia: Secondary | ICD-10-CM | POA: Diagnosis not present

## 2020-12-06 DIAGNOSIS — F411 Generalized anxiety disorder: Secondary | ICD-10-CM | POA: Diagnosis not present

## 2020-12-06 MED ORDER — OXCARBAZEPINE 300 MG PO TABS: 300.0000 mg | ORAL_TABLET | Freq: Two times a day (BID) | ORAL | 1 refills | Status: DC

## 2020-12-06 MED ORDER — PERPHENAZINE 4 MG PO TABS
4.0000 mg | ORAL_TABLET | ORAL | 1 refills | Status: DC
Start: 2020-12-06 — End: 2020-12-31

## 2020-12-06 NOTE — Progress Notes (Signed)
[facility] Follow up visit  Patient Identification: Shannon Boyd MRN:  [id number] Date of Evaluation:  12/06/2020 Referral Source: primary care. Also by our Therapist [facility] Chief Complaint:   follow up med review Visit Diagnosis:    ICD-10-CM   1. Schizoaffective disorder, depressive type ([place])  F25.1   2. GAD (generalized anxiety disorder)  F41.1   3. Adjustment insomnia  F51.02       I connected with Shannon Boyd on 12/06/20 at 10:00 AM EST by a video enabled telemedicine application and verified that I am speaking with the correct person using two identifiers.  I discussed the limitations of evaluation and management by telemedicine and the availability of in person appointments. The patient expressed understanding and agreed to proceed.  Patient location: home Provider location : home office  History of Present Illness: Patient is a 35 years old currently single Caucasian female is living with her mom she is currently on disability for schizophrenia, obesity and other diagnoses.  Has been part of [facility] before Brief history "    Gets subdued, struggles with depression but says working on coping skills and therapy Denies exacerbation of hallucinations Takes nap during the day, sleep poor at times at night  Denies tremors She is attending a support group for depression that helps and therapy   Aggravating factor: difficult childhood and dealing with mom. lonliness Modifying factor: housing Duration since young age Drug use : denies  Past psych meds: Latuda, risperdal. abilify . Had side effects   Past Psychiatric History: schizoaffective disorder  Previous Psychotropic Medications: Yes   Substance Abuse History in the last 12 months:  No.  Consequences of Substance Abuse: NA  Past Medical History:  Past Medical History:  Diagnosis Date  . Anxiety   . Asthma   . Depression   . History of fracture of toe   . History of sprained ankle   . Hypertension   .  Schizoaffective disorder [facility])     Past Surgical History:  Procedure Laterality Date  . ADENOIDECTOMY    . SALPINGECTOMY Bilateral   . WISDOM TOOTH EXTRACTION      Family Psychiatric History: Mom told patient . Her dad had schizophrenia  Family History:  Family History  Problem Relation Age of Onset  . Depression Mother   . Anxiety disorder Mother   . Seizures Mother   . Breast cancer Mother   . Dementia Mother   . Cancer Mother        breast  . Schizophrenia Father   . Stroke Maternal Grandmother     Social History:   Social History   Socioeconomic History  . Marital status: Single    Spouse name: Not on file  . Number of children: Not on file  . Years of education: Not on file  . Highest education level: Not on file  Occupational History  . Not on file  Tobacco Use  . Smoking status: Never Smoker  . Smokeless tobacco: Never Used  Vaping Use  . Vaping Use: Never used  Substance and Sexual Activity  . Alcohol use: Yes    Comment: Rarely  . Drug use: No  . Sexual activity: Never  Other Topics Concern  . Not on file  Social History Narrative  . Not on file   Social Determinants of Health   Financial Resource Strain: Not on file  Food Insecurity: Not on file  Transportation Needs: Not on file  Physical Activity: Not on file  Stress: Not on  file  Social Connections: Not on file       Allergies:   Allergies  Allergen Reactions  . Aripiprazole Swelling  . Codeine   . Doxycycline Rash  . Latuda [Lurasidone Hcl]   . Lurasidone Nausea And Vomiting       . Penicillins   . Pseudoeph-Bromphen-Dm     Other reaction(s): Unknown  . Resperal-Dm [Pseudoeph-Bromphen-Dm] Hives  . Risperidone And Related Hives  . Sulfa Antibiotics Other (See Comments)    Pt can't remember  . Topamax [Topiramate]     Metabolic Disorder Labs: No results found for: HGBA1C, MPG No results found for: PROLACTIN No results found for: CHOL, TRIG, HDL, CHOLHDL, VLDL,  LDLCALC No results found for: TSH  Therapeutic Level Labs: No results found for: LITHIUM No results found for: CBMZ No results found for: VALPROATE  Current Medications: Current Outpatient Medications  Medication Sig Dispense Refill  . benztropine (COGENTIN) 0.5 MG tablet Take 1 tablet (0.5 mg total) by mouth 2 (two) times daily. 60 tablet 0  . Cholecalciferol (VITAMIN D3) 50 MCG (2000 UT) capsule Take 2,000 Units by mouth daily.    . famotidine (PEPCID) 20 MG tablet TAKE 1 TABLET BY MOUTH EVERY DAY 90 tablet 1  . ibuprofen (ADVIL,MOTRIN) 800 MG tablet Take 800 mg by mouth every 8 (eight) hours as needed.    . melatonin 1 MG TABS tablet Take 1-2 mg by mouth at bedtime as needed.     Marland Kitchen omeprazole (PRILOSEC) 40 MG capsule Take 1 capsule (40 mg total) by mouth daily. 90 capsule 1  . Oxcarbazepine (TRILEPTAL) 300 MG tablet Take 1 tablet (300 mg total) by mouth 2 (two) times daily. 60 tablet 1  . perphenazine (TRILAFON) 4 MG tablet Take 1 tablet (4 mg total) by mouth as directed. Take 1 tablet by mouth in the morning and 2 at bedtime 90 tablet 1  . venlafaxine XR (EFFEXOR-XR) 37.5 MG 24 hr capsule TAKE 3 CAPSULES (112.5 MG TOTAL) BY MOUTH DAILY WITH BREAKFAST. 270 capsule 0   No current facility-administered medications for this visit.       Psychiatric Specialty Exam: Review of Systems  Cardiovascular: Negative for chest pain.  Skin: Negative for rash.  Psychiatric/Behavioral: Negative for substance abuse and suicidal ideas.    There were no vitals taken for this visit.There is no height or weight on file to calculate BMI.  General Appearance: Casual  Eye Contact:  Fair  Speech:  Normal Rate  Volume:  Decreased  Mood: somewhat subdued  Affect:  Constricted  Thought Process:  Goal Directed  Orientation:  Full (Time, Place, and Person)  Thought Content:  Rumination  Suicidal Thoughts:  No  Homicidal Thoughts:  No  Memory:  Immediate;   Fair Recent;   Fair  Judgement:  Fair   Insight:  Shallow  Psychomotor Activity:  Normal  Concentration:  Concentration: Fair and Attention Span: Fair  Recall:  [employer] of Knowledge:Fair  Language: Fair  Akathisia:  No  Handed:  Right  AIMS (if indicated):  No involuntary movements  Assets:  Desire for Improvement Financial Resources/Insurance Leisure Time Physical Health  ADL's:  Intact  Cognition: WNL  Sleep:  Fair   Screenings: GAD-7   Flowsheet Row Office Visit from 04/16/2020 in [facility]  Total GAD-7 Score 5    PHQ2-9   Flowsheet Row Office Visit from 04/16/2020 in [facility]  PHQ-2 Total Score 2  PHQ-9 Total Score 9      Assessment and Plan: as follows Schizoaffective disorder, subdued but feels meds are at right dose, she continues to work on distraction from negative thoughts and in therapy to work on Pharmacologist, continue perphenazine, trileptal   GAD: fluctuates, continue effexor and therapy Past history of OD on benzo, will avoid and she is working on Pharmacologist and attends an Emotionally annonymous group and has sponsor to call Insomnia; not worse, continue sleep hygiene I discussed the assessment and treatment plan with the patient. The patient was provided an opportunity to ask questions and all were answered. The patient agreed with the plan and demonstrated an understanding of the instructions.   The patient was advised to call back or seek an in-person evaluation if the symptoms worsen or if the condition fails to improve as anticipated. Non face to face time spent: .  Fu 16m . meds sent which were due and discussed   Thresa Ross, MD 1/6/202210:11 AM

## 2020-12-12 DIAGNOSIS — H5213 Myopia, bilateral: Secondary | ICD-10-CM | POA: Diagnosis not present

## 2020-12-12 DIAGNOSIS — H52209 Unspecified astigmatism, unspecified eye: Secondary | ICD-10-CM | POA: Diagnosis not present

## 2020-12-19 ENCOUNTER — Ambulatory Visit (INDEPENDENT_AMBULATORY_CARE_PROVIDER_SITE_OTHER): Payer: Medicare HMO | Admitting: Licensed Clinical Social Worker

## 2020-12-19 DIAGNOSIS — F411 Generalized anxiety disorder: Secondary | ICD-10-CM

## 2020-12-19 DIAGNOSIS — F5102 Adjustment insomnia: Secondary | ICD-10-CM

## 2020-12-19 DIAGNOSIS — F251 Schizoaffective disorder, depressive type: Secondary | ICD-10-CM

## 2020-12-19 DIAGNOSIS — Z6834 Body mass index (BMI) 34.0-34.9, adult: Secondary | ICD-10-CM | POA: Diagnosis not present

## 2020-12-19 DIAGNOSIS — R7402 Elevation of levels of lactic acid dehydrogenase (LDH): Secondary | ICD-10-CM | POA: Diagnosis not present

## 2020-12-19 DIAGNOSIS — E669 Obesity, unspecified: Secondary | ICD-10-CM | POA: Diagnosis not present

## 2020-12-19 NOTE — Progress Notes (Signed)
Virtual Visit via Telephone Note  I connected with Rimsha Deschler on 12/19/20 at  9:00 AM EST by telephone and verified that I am speaking with the correct person using two identifiers.  Location: Patient: home Provider: home office   I discussed the limitations, risks, security and privacy concerns of performing an evaluation and management service by telephone and the availability of in person appointments. I also discussed with the patient that there may be a patient responsible charge related to this service. The patient expressed understanding and agreed to proceed.   I discussed the assessment and treatment plan with the patient. The patient was provided an opportunity to ask questions and all were answered. The patient agreed with the plan and demonstrated an understanding of the instructions.   The patient was advised to call back or seek an in-person evaluation if the symptoms worsen or if the condition fails to improve as anticipated.  I provided 53 minutes of non-face-to-face time during this encounter.   THERAPIST PROGRESS NOTE  Session Time: 9:00 AM to 9:53 AM  Participation Level: Active  Behavioral Response: CasualAlertDysphoric  Type of Therapy: Individual Therapy  Treatment Goals addressed: maintain stability with mood, learn and apply effective strategies to manage mood, coping   Interventions: Solution Focused, Strength-based, Supportive and Other: coping  Summary: Laurey Salser is a 35 y.o. 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 Realize she needs to save up for her own car. Try to make the best of it. She has a lot of hope and a lot of reservations about it. She will try and see. 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 Reason for taking job that mom needs access to car more, she is on new medicine and looking neurologist. Discussed doubts with faith. She knows that it is part of life.  Hoping to see some evidence that God is real.  Struggling with that. Asking to show her and so far haven't seen anything yet. Hasn't talked to anybody yet. Definitely believe in supernatural and things that can't be explained wondering about some of the foundations what she is supposed to believe in. Wants to find texts that written about Jesus written outside the Bible and doesn't know how to find it. Something grateful for with Mom having difficulties in 2017 thought a good chance be homeless. Couldn't stay there thankfully a friend through church took her in. Maybe that was God if it wasn't for church and God don't know what would happen. Seeing that have experiences that don't like but valuable. Therapist talked about value of living a spiritual life and patient shares that hopes this job will be of service because it will be very humbling. Feels spiritual prescience when praying and singing in church. With others around her worshipping with her. Why singing is so important feel happiness or sense of connection. Patient plans to talk to sponsor for input although helpful for her to talk about in session.        Suicidal/Homicidal: No  Therapist Response: Therapist reviewed symptoms, facilitated expression of thoughts and feelings, reviewed treatment plan and patient gave consent to complete virtually.  Processed patient's thoughts and feelings related to current struggles with spirituality, therapist utilized this as significant intervention to help patient with coping, consider therapist feedback about spirituality for patient's consideration to help her through the struggles.  Therapist emphasized both saying meaning, purpose fullness of living a spiritual  life is living a more actualized life.  Recognizing that even though we do not have control over a lot of what happens we do have agency in her life to contribute to making her life purposeful and meaningful.  Provided positive feedback for patient  finding a job and positive aspects of that, providing positive feedback for patient's positive attitude about it of mumbling her and being of service, at the same time helping with coping to know remains patient's choice whether she will stay or whether she will look for something else.  Therapist provided active listening, open questions supportive interventions.  Plan: Return again in 5 weeks.2.  Therapist work with patient on stress management, mood management, coping  Diagnosis: Axis I:  schizoaffective disorder, depressive type, generalized anxiety disorder, adjustment insomnia      Axis II: No diagnosis    Coolidge Breeze, LCSW 12/19/2020

## 2020-12-20 ENCOUNTER — Ambulatory Visit (HOSPITAL_COMMUNITY): Payer: Medicare Other | Admitting: Licensed Clinical Social Worker

## 2020-12-28 ENCOUNTER — Other Ambulatory Visit: Payer: Self-pay | Admitting: Medical-Surgical

## 2020-12-30 ENCOUNTER — Other Ambulatory Visit (HOSPITAL_COMMUNITY): Payer: Self-pay | Admitting: Psychiatry

## 2020-12-31 DIAGNOSIS — Z01812 Encounter for preprocedural laboratory examination: Secondary | ICD-10-CM | POA: Diagnosis not present

## 2020-12-31 DIAGNOSIS — Z20822 Contact with and (suspected) exposure to covid-19: Secondary | ICD-10-CM | POA: Diagnosis not present

## 2021-01-01 ENCOUNTER — Other Ambulatory Visit: Payer: Self-pay

## 2021-01-01 ENCOUNTER — Encounter: Payer: Self-pay | Admitting: Medical-Surgical

## 2021-01-01 ENCOUNTER — Other Ambulatory Visit (HOSPITAL_COMMUNITY): Payer: Self-pay | Admitting: Psychiatry

## 2021-01-01 ENCOUNTER — Ambulatory Visit (INDEPENDENT_AMBULATORY_CARE_PROVIDER_SITE_OTHER): Payer: Medicare HMO

## 2021-01-01 ENCOUNTER — Ambulatory Visit (INDEPENDENT_AMBULATORY_CARE_PROVIDER_SITE_OTHER): Payer: Medicare HMO | Admitting: Sports Medicine

## 2021-01-01 VITALS — BP 125/83 | HR 109 | Temp 98.5°F | Ht 65.0 in | Wt 213.1 lb

## 2021-01-01 DIAGNOSIS — S99911A Unspecified injury of right ankle, initial encounter: Secondary | ICD-10-CM

## 2021-01-01 DIAGNOSIS — M7989 Other specified soft tissue disorders: Secondary | ICD-10-CM | POA: Diagnosis not present

## 2021-01-01 DIAGNOSIS — S8264XA Nondisplaced fracture of lateral malleolus of right fibula, initial encounter for closed fracture: Secondary | ICD-10-CM

## 2021-01-01 DIAGNOSIS — S8261XA Displaced fracture of lateral malleolus of right fibula, initial encounter for closed fracture: Secondary | ICD-10-CM | POA: Insufficient documentation

## 2021-01-01 DIAGNOSIS — M25471 Effusion, right ankle: Secondary | ICD-10-CM | POA: Diagnosis not present

## 2021-01-01 DIAGNOSIS — Y9301 Activity, walking, marching and hiking: Secondary | ICD-10-CM

## 2021-01-01 NOTE — Assessment & Plan Note (Signed)
This is a pleasant 35 year old female, she inverted her ankle today while walking, she had immediate pain, swelling, she was able to limp for a few steps. On exam she has tenderness at the lateral malleolus, not so much over the ATFL. Ankle is stable, neurovascularly intact distally. Unfortunately she does have a breast reduction surgery coming up. We are can get x-rays, adding a cam boot, she will take some prescription strength ibuprofen for the pain. Return to see me with sports medicine in 2 weeks. I think she is completely fine to have her breast reduction surgery, I would just simply recommend that we do an aspirin postop day 0 after her surgery.

## 2021-01-01 NOTE — Progress Notes (Signed)
    Procedures performed today:    None.  Independent interpretation of notes and tests performed by another provider:   X-rays personally reviewed and do show a small avulsion from the lateral malleolus.  Brief History, Exam, Impression, and Recommendations:    Fracture of ankle, lateral malleolus, right, closed This is a pleasant 35 year old female, she inverted her ankle today while walking, she had immediate pain, swelling, she was able to limp for a few steps. On exam she has tenderness at the lateral malleolus, not so much over the ATFL. Ankle is stable, neurovascularly intact distally. Unfortunately she does have a breast reduction surgery coming up. We are can get x-rays, adding a cam boot, she will take some prescription strength ibuprofen for the pain. Return to see me with sports medicine in 2 weeks. I think she is completely fine to have her breast reduction surgery, I would just simply recommend that we do an aspirin postop day 0 after her surgery.    ___________________________________________ Ihor Austin. Benjamin Stain, M.D., ABFM., CAQSM. Primary Care and Sports Medicine [facility]  Adjunct Instructor of Family Medicine  [employer]

## 2021-01-01 NOTE — Progress Notes (Signed)
Subjective:    CC: right ankle pain  HPI: Pleasant 35 year old female presenting for right ankle pain after she twisted her ankle this morning walking from her house to her car. She was wearing [employer] shoes and stepped wrong. Her right ankle twisted with supination. After the injury, she was unable to walk on it normally, instead limping to get into the house. Since then, has not attempted to walk on it normally. Took Tylenol prior to her appointment which has helped some.   I reviewed the past medical history, family history, social history, surgical history, and allergies today and no changes were needed.  Please see the problem list section below in epic for further details.  Past Medical History: Past Medical History:  Diagnosis Date  . Anxiety   . Asthma   . Depression   . History of fracture of toe   . History of sprained ankle   . Hypertension   . Schizoaffective disorder [facility])    Past Surgical History: Past Surgical History:  Procedure Laterality Date  . ADENOIDECTOMY    . SALPINGECTOMY Bilateral   . WISDOM TOOTH EXTRACTION     Social History: Social History   Socioeconomic History  . Marital status: Single    Spouse name: Not on file  . Number of children: Not on file  . Years of education: Not on file  . Highest education level: Not on file  Occupational History  . Not on file  Tobacco Use  . Smoking status: Never Smoker  . Smokeless tobacco: Never Used  Vaping Use  . Vaping Use: Never used  Substance and Sexual Activity  . Alcohol use: Yes    Comment: Rarely  . Drug use: No  . Sexual activity: Never  Other Topics Concern  . Not on file  Social History Narrative  . Not on file   Social Determinants of Health   Financial Resource Strain: Not on file  Food Insecurity: Not on file  Transportation Needs: Not on file  Physical Activity: Not on file  Stress: Not on file  Social Connections: Not on file   Family History: Family History  Problem  Relation Age of Onset  . Depression Mother   . Anxiety disorder Mother   . Seizures Mother   . Breast cancer Mother   . Dementia Mother   . Cancer Mother        breast  . Schizophrenia Father   . Stroke Maternal Grandmother    Allergies: Allergies  Allergen Reactions  . Aripiprazole Swelling  . Codeine   . Doxycycline Rash  . Latuda [Lurasidone Hcl]   . Lurasidone Nausea And Vomiting       . Penicillins   . Pseudoeph-Bromphen-Dm     Other reaction(s): Unknown  . Resperal-Dm [Pseudoeph-Bromphen-Dm] Hives  . Risperidone And Related Hives  . Sulfa Antibiotics Other (See Comments)    Pt can't remember  . Topamax [Topiramate]    Medications: See med rec.  Review of Systems: See HPI for pertinent positives and negatives.   Objective:    General: Well Developed, well nourished, and in no acute distress.  Neuro: Alert and oriented x3.  HEENT: Normocephalic, atraumatic.  Skin: Warm and dry. Cardiac: Regular rate and rhythm.  Respiratory: Clear to auscultation bilaterally. Not using accessory muscles, speaking in full sentences. Right ankle: lateral malleolar edema and tenderness. Pain with supination, flexion, and extension. No bruising.    Impression and Recommendations:    1. Ankle injury, right, initial encounter Getting  x-rays. Consulted with Dr. Karie Schwalbe for further evaluation and management.  ___________________________________________ Thayer Ohm, DNP, APRN, FNP-BC Primary Care and Sports Medicine [facility]

## 2021-01-03 DIAGNOSIS — F259 Schizoaffective disorder, unspecified: Secondary | ICD-10-CM | POA: Diagnosis not present

## 2021-01-03 DIAGNOSIS — M542 Cervicalgia: Secondary | ICD-10-CM | POA: Diagnosis not present

## 2021-01-03 DIAGNOSIS — N62 Hypertrophy of breast: Secondary | ICD-10-CM | POA: Diagnosis not present

## 2021-01-03 DIAGNOSIS — Z882 Allergy status to sulfonamides status: Secondary | ICD-10-CM | POA: Diagnosis not present

## 2021-01-03 DIAGNOSIS — R21 Rash and other nonspecific skin eruption: Secondary | ICD-10-CM | POA: Diagnosis not present

## 2021-01-03 DIAGNOSIS — M1990 Unspecified osteoarthritis, unspecified site: Secondary | ICD-10-CM | POA: Diagnosis not present

## 2021-01-03 DIAGNOSIS — M546 Pain in thoracic spine: Secondary | ICD-10-CM | POA: Diagnosis not present

## 2021-01-03 DIAGNOSIS — K219 Gastro-esophageal reflux disease without esophagitis: Secondary | ICD-10-CM | POA: Diagnosis not present

## 2021-01-03 DIAGNOSIS — Z885 Allergy status to narcotic agent status: Secondary | ICD-10-CM | POA: Diagnosis not present

## 2021-01-03 DIAGNOSIS — Z91048 Other nonmedicinal substance allergy status: Secondary | ICD-10-CM | POA: Diagnosis not present

## 2021-01-03 DIAGNOSIS — F419 Anxiety disorder, unspecified: Secondary | ICD-10-CM | POA: Diagnosis not present

## 2021-01-03 DIAGNOSIS — G4730 Sleep apnea, unspecified: Secondary | ICD-10-CM | POA: Diagnosis not present

## 2021-01-03 DIAGNOSIS — Z888 Allergy status to other drugs, medicaments and biological substances status: Secondary | ICD-10-CM | POA: Diagnosis not present

## 2021-01-03 DIAGNOSIS — Z881 Allergy status to other antibiotic agents status: Secondary | ICD-10-CM | POA: Diagnosis not present

## 2021-01-03 DIAGNOSIS — F32A Depression, unspecified: Secondary | ICD-10-CM | POA: Diagnosis not present

## 2021-01-03 DIAGNOSIS — I10 Essential (primary) hypertension: Secondary | ICD-10-CM | POA: Diagnosis not present

## 2021-01-03 DIAGNOSIS — Z880 Allergy status to penicillin: Secondary | ICD-10-CM | POA: Diagnosis not present

## 2021-01-03 DIAGNOSIS — E785 Hyperlipidemia, unspecified: Secondary | ICD-10-CM | POA: Diagnosis not present

## 2021-01-03 DIAGNOSIS — Z6833 Body mass index (BMI) 33.0-33.9, adult: Secondary | ICD-10-CM | POA: Diagnosis not present

## 2021-01-03 DIAGNOSIS — G43109 Migraine with aura, not intractable, without status migrainosus: Secondary | ICD-10-CM | POA: Diagnosis not present

## 2021-01-03 DIAGNOSIS — E669 Obesity, unspecified: Secondary | ICD-10-CM | POA: Diagnosis not present

## 2021-01-03 DIAGNOSIS — M549 Dorsalgia, unspecified: Secondary | ICD-10-CM | POA: Diagnosis not present

## 2021-01-03 DIAGNOSIS — J4520 Mild intermittent asthma, uncomplicated: Secondary | ICD-10-CM | POA: Diagnosis not present

## 2021-01-07 ENCOUNTER — Other Ambulatory Visit: Payer: Self-pay

## 2021-01-07 MED ORDER — IBUPROFEN 800 MG PO TABS: 800.0000 mg | ORAL_TABLET | Freq: Three times a day (TID) | ORAL | 2 refills | Status: AC | PRN

## 2021-01-08 ENCOUNTER — Telehealth (INDEPENDENT_AMBULATORY_CARE_PROVIDER_SITE_OTHER): Payer: Medicare HMO | Admitting: Medical-Surgical

## 2021-01-08 ENCOUNTER — Other Ambulatory Visit: Payer: Self-pay

## 2021-01-08 ENCOUNTER — Encounter: Payer: Self-pay | Admitting: Medical-Surgical

## 2021-01-08 DIAGNOSIS — L744 Anhidrosis: Secondary | ICD-10-CM | POA: Diagnosis not present

## 2021-01-08 DIAGNOSIS — Z9889 Other specified postprocedural states: Secondary | ICD-10-CM | POA: Diagnosis not present

## 2021-01-08 NOTE — Progress Notes (Signed)
Virtual Visit via Video Note  I connected with Shannon Boyd on 01/08/21 at  9:40 AM EST by a video enabled telemedicine application and verified that I am speaking with the correct person using two identifiers.   I discussed the limitations of evaluation and management by telemedicine and the availability of in person appointments. The patient expressed understanding and agreed to proceed.  Patient location: home Provider locations: office  Subjective:    CC: not sweating after surgery  HPI: Pleasant 35 year old female presenting via MyChart video visit with complaints of not sweating after her surgery.  Had bilateral breast reduction 5 days ago and has been discharged home with 2 JP drains in place.  Notes that during the first couple of days after her surgery, she disconnected the bulb of one of her JP drains from the tubing and threw it away.  She has not called her surgeon regarding this issue and has the tubing clamped off for now.  Reports that she previously experienced profuse sweating but in the last 5 days, she has barely sweat at all. This is very concerning to her and she wanted to make sure it is nothing serious. No documented fevers although her temperature has been fluctuating.  No other symptoms noted.  Past medical history, Surgical history, Family history not pertinant except as noted below, Social history, Allergies, and medications have been entered into the medical record, reviewed, and corrections made.   Review of Systems: See HPI for pertinent positives and negatives.   Objective:    General: Speaking clearly in complete sentences without any shortness of breath.  Alert and oriented x3.  Normal judgment. No apparent acute distress.  Impression and Recommendations:    1. Anhidrosis Suspect this is in relation to a combination of things.  She is on several anticholinergic medications prescribed by psychiatry which can absolutely contribute to the reduction and  splinting.  She is also in a state of flux as her body recovers from her recent surgery.  Recommend monitoring with the expectation that her normal sweating patterns will return over the next few weeks.  2. Status post surgery Strongly recommend calling plastic surgery office regarding the JP drain bulb that was thrown away.  At very high risk of developing a seroma which could cause further surgical complications.  I discussed the assessment and treatment plan with the patient. The patient was provided an opportunity to ask questions and all were answered. The patient agreed with the plan and demonstrated an understanding of the instructions.   The patient was advised to call back or seek an in-person evaluation if the symptoms worsen or if the condition fails to improve as anticipated.  20 minutes of non-face-to-face time was provided during this encounter.  Return if symptoms worsen or fail to improve.  Thayer Ohm, DNP, APRN, FNP-BC [facility] and Sports Medicine

## 2021-01-09 DIAGNOSIS — Z9889 Other specified postprocedural states: Secondary | ICD-10-CM | POA: Diagnosis not present

## 2021-01-09 DIAGNOSIS — Z6834 Body mass index (BMI) 34.0-34.9, adult: Secondary | ICD-10-CM | POA: Diagnosis not present

## 2021-01-09 DIAGNOSIS — E669 Obesity, unspecified: Secondary | ICD-10-CM | POA: Diagnosis not present

## 2021-01-10 ENCOUNTER — Other Ambulatory Visit: Payer: Self-pay | Admitting: Medical-Surgical

## 2021-01-11 ENCOUNTER — Telehealth (INDEPENDENT_AMBULATORY_CARE_PROVIDER_SITE_OTHER): Payer: Medicare HMO | Admitting: Psychiatry

## 2021-01-11 ENCOUNTER — Encounter (HOSPITAL_COMMUNITY): Payer: Self-pay | Admitting: Psychiatry

## 2021-01-11 DIAGNOSIS — F251 Schizoaffective disorder, depressive type: Secondary | ICD-10-CM

## 2021-01-11 DIAGNOSIS — F411 Generalized anxiety disorder: Secondary | ICD-10-CM | POA: Diagnosis not present

## 2021-01-11 DIAGNOSIS — F5102 Adjustment insomnia: Secondary | ICD-10-CM

## 2021-01-11 NOTE — Progress Notes (Signed)
[facility] Follow up visit  Patient Identification: Galen Malkowski MRN:  [id number] Date of Evaluation:  01/11/2021 Referral Source: primary care. Also by our Therapist [facility] Chief Complaint:    early follow up  Visit Diagnosis:    ICD-10-CM   1. Schizoaffective disorder, depressive type (HCC)  F25.1   2. GAD (generalized anxiety disorder)  F41.1   3. Adjustment insomnia  F51.02       Virtual Visit via Telephone Note  I connected with Julyssa Blasko on 01/11/21 at 11:15 AM EST by telephone and verified that I am speaking with the correct person using two identifiers.  Location: Patient: home  Provider: home office   I discussed the limitations, risks, security and privacy concerns of performing an evaluation and management service by telephone and the availability of in person appointments. I also discussed with the patient that there may be a patient responsible charge related to this service. The patient expressed understanding and agreed to proceed.       I discussed the assessment and treatment plan with the patient. The patient was provided an opportunity to ask questions and all were answered. The patient agreed with the plan and demonstrated an understanding of the instructions.   The patient was advised to call back or seek an in-person evaluation if the symptoms worsen or if the condition fails to improve as anticipated.  I provided 15 minutes of non-face-to-face time during this encounter.   Thresa Ross, MD   History of Present Illness: Patient is a 35 years old currently single Caucasian female is living with her mom she is currently on disability for schizophrenia, obesity and other diagnoses.  Has been part of [facility] before Brief history "    Earlier visit, had breast reduction surgery and had tubes put in. She did visit surgeon and tubes are out, also hurt her leg and got evaluated  She is having dry mouth after surgery and was feeling somewhat sedate or foggy so  have cut down some meds She is having sleep concerns and early follow up to review meds Denies hallucinations Has some support of mom , discussed to have support to commute to doc visits follow ups Denies tremors exacerbation    Aggravating factor: recent surgery, difficult childhood and dealing with mom. lonliness Modifying factor: hhousing Duration since young age Drug use : denies  Past psych meds: Latuda, risperdal. abilify . Had side effects   Past Psychiatric History: schizoaffective disorder  Previous Psychotropic Medications: Yes   Substance Abuse History in the last 12 months:  No.  Consequences of Substance Abuse: NA  Past Medical History:  Past Medical History:  Diagnosis Date  . Anxiety   . Asthma   . Depression   . History of fracture of toe   . History of sprained ankle   . Hypertension   . Schizoaffective disorder [facility])     Past Surgical History:  Procedure Laterality Date  . ADENOIDECTOMY    . SALPINGECTOMY Bilateral   . WISDOM TOOTH EXTRACTION      Family Psychiatric History: Mom told patient . Her dad had schizophrenia  Family History:  Family History  Problem Relation Age of Onset  . Depression Mother   . Anxiety disorder Mother   . Seizures Mother   . Breast cancer Mother   . Dementia Mother   . Cancer Mother        breast  . Schizophrenia Father   . Stroke Maternal Grandmother     Social History:  Social History   Socioeconomic History  . Marital status: Single    Spouse name: Not on file  . Number of children: Not on file  . Years of education: Not on file  . Highest education level: Not on file  Occupational History  . Not on file  Tobacco Use  . Smoking status: Never Smoker  . Smokeless tobacco: Never Used  Vaping Use  . Vaping Use: Never used  Substance and Sexual Activity  . Alcohol use: Yes    Comment: Rarely  . Drug use: No  . Sexual activity: Never  Other Topics Concern  . Not on file  Social History  Narrative  . Not on file   Social Determinants of Health   Financial Resource Strain: Not on file  Food Insecurity: Not on file  Transportation Needs: Not on file  Physical Activity: Not on file  Stress: Not on file  Social Connections: Not on file       Allergies:   Allergies  Allergen Reactions  . Aripiprazole Swelling  . Codeine   . Doxycycline Rash  . Latuda [Lurasidone Hcl]   . Lurasidone Nausea And Vomiting       . Penicillins   . Pseudoeph-Bromphen-Dm     Other reaction(s): Unknown  . Resperal-Dm [Pseudoeph-Bromphen-Dm] Hives  . Risperidone And Related Hives  . Sulfa Antibiotics Other (See Comments)    Pt can't remember  . Topamax [Topiramate]     Metabolic Disorder Labs: No results found for: HGBA1C, MPG No results found for: PROLACTIN No results found for: CHOL, TRIG, HDL, CHOLHDL, VLDL, LDLCALC No results found for: TSH  Therapeutic Level Labs: No results found for: LITHIUM No results found for: CBMZ No results found for: VALPROATE  Current Medications: Current Outpatient Medications  Medication Sig Dispense Refill  . benztropine (COGENTIN) 0.5 MG tablet TAKE 1 TABLET BY MOUTH 2 TIMES DAILY. 60 tablet 0  . Cholecalciferol (VITAMIN D3) 50 MCG (2000 UT) capsule Take 2,000 Units by mouth daily.    . famotidine (PEPCID) 20 MG tablet TAKE 1 TABLET BY MOUTH EVERY DAY 90 tablet 1  . ibuprofen (ADVIL) 800 MG tablet Take 1 tablet (800 mg total) by mouth every 8 (eight) hours as needed. 90 tablet 2  . melatonin 1 MG TABS tablet Take 1-2 mg by mouth at bedtime as needed.     Marland Kitchen omeprazole (PRILOSEC) 40 MG capsule Take 1 capsule (40 mg total) by mouth daily. **PATIENT NEEDS OFFICE VISIT FOR ADDITIONAL REFILLS** 30 capsule 0  . Oxcarbazepine (TRILEPTAL) 300 MG tablet Take 1 tablet (300 mg total) by mouth 2 (two) times daily. 60 tablet 1  . perphenazine (TRILAFON) 4 MG tablet TAKE 1 TABLET BY MOUTH IN THE MORNING AND 2 TABLETS AT BEDTIME. 90 tablet 2  . sodium  fluoride (FLUORISHIELD) 1.1 % GEL dental gel Denta 5000 Plus 1.1 % cream    . venlafaxine XR (EFFEXOR-XR) 37.5 MG 24 hr capsule TAKE 3 CAPSULES (112.5 MG TOTAL) BY MOUTH DAILY WITH BREAKFAST. 270 capsule 0   No current facility-administered medications for this visit.       Psychiatric Specialty Exam: Review of Systems  Cardiovascular: Negative for chest pain.  Psychiatric/Behavioral: Negative for substance abuse and suicidal ideas.    There were no vitals taken for this visit.There is no height or weight on file to calculate BMI.  General Appearance:   Eye Contact:    Speech:  Normal Rate  Volume:  Decreased  Mood: somewhat subdued  Affect:  Constricted  Thought Process:  Goal Directed  Orientation:  Full (Time, Place, and Person)  Thought Content:  Rumination  Suicidal Thoughts:  No  Homicidal Thoughts:  No  Memory:  Immediate;   Fair Recent;   Fair  Judgement:  Fair  Insight:  Shallow  Psychomotor Activity:  Normal  Concentration:  Concentration: Fair and Attention Span: Fair  Recall:  [employer] of Knowledge:Fair  Language: Fair  Akathisia:  No  Handed:  Right  AIMS (if indicated):  No involuntary movements  Assets:  Desire for Improvement Financial Resources/Insurance Leisure Time Physical Health  ADL's:  Intact  Cognition: WNL  Sleep:  Fair   Screenings: GAD-7   Flowsheet Row Office Visit from 04/16/2020 in [facility]  Total GAD-7 Score 5    PHQ2-9   Flowsheet Row Office Visit from 04/16/2020 in [facility]  PHQ-2 Total Score 2  PHQ-9 Total Score 9      Assessment and Plan: as follows Schizoaffective disorder, not hallucinating , was feeling foggy, has cut down some meds, continue night time trilofan and meds can skip morning trilofan as she feels foggy after surgery Lower cogentin to one a day for now as she is having dry mouth Follow up early if needed,   GAD: worry related to  post surgery, continue effexor Provided supportive therapy  Past history of OD on benzo, will avoid and she is working on Pharmacologist and attends an Emotionally annonymous group and has sponsor to call Insomnia; irregular sleep, discussed sleep hygiene and having irregular sleep post surgery, continue trilofan at night  Fu 1 m or earlier if needed   Thresa Ross, MD 2/11/202211:34 AM

## 2021-01-13 ENCOUNTER — Encounter: Payer: Self-pay | Admitting: Medical-Surgical

## 2021-01-15 DIAGNOSIS — Z6832 Body mass index (BMI) 32.0-32.9, adult: Secondary | ICD-10-CM | POA: Diagnosis not present

## 2021-01-15 DIAGNOSIS — E785 Hyperlipidemia, unspecified: Secondary | ICD-10-CM | POA: Diagnosis not present

## 2021-01-15 DIAGNOSIS — Z8639 Personal history of other endocrine, nutritional and metabolic disease: Secondary | ICD-10-CM | POA: Diagnosis not present

## 2021-01-15 DIAGNOSIS — E669 Obesity, unspecified: Secondary | ICD-10-CM | POA: Diagnosis not present

## 2021-01-16 ENCOUNTER — Other Ambulatory Visit: Payer: Self-pay

## 2021-01-16 ENCOUNTER — Ambulatory Visit (INDEPENDENT_AMBULATORY_CARE_PROVIDER_SITE_OTHER): Payer: Medicare HMO

## 2021-01-16 ENCOUNTER — Encounter: Payer: Self-pay | Admitting: Medical-Surgical

## 2021-01-16 ENCOUNTER — Ambulatory Visit (INDEPENDENT_AMBULATORY_CARE_PROVIDER_SITE_OTHER): Payer: Medicare HMO | Admitting: Sports Medicine

## 2021-01-16 DIAGNOSIS — S8264XA Nondisplaced fracture of lateral malleolus of right fibula, initial encounter for closed fracture: Secondary | ICD-10-CM | POA: Diagnosis not present

## 2021-01-16 DIAGNOSIS — M7989 Other specified soft tissue disorders: Secondary | ICD-10-CM | POA: Diagnosis not present

## 2021-01-16 DIAGNOSIS — S8264XD Nondisplaced fracture of lateral malleolus of right fibula, subsequent encounter for closed fracture with routine healing: Secondary | ICD-10-CM

## 2021-01-16 NOTE — Progress Notes (Signed)
    Procedures performed today:    None.  Independent interpretation of notes and tests performed by another provider:   None.  Brief History, Exam, Impression, and Recommendations:    Fracture of ankle, lateral malleolus, right, closed At this point she is about 2 to 3 weeks post lateral malleolar fracture, overall doing okay. Getting some updated x-rays, continue boot for another 2 weeks. Return to see me in 2 weeks and we will likely transition her into an ASO she is nontender over her fracture.    ___________________________________________ Ihor Austin. Benjamin Stain, M.D., ABFM., CAQSM. Primary Care and Sports Medicine [facility]  Adjunct Instructor of Family Medicine  [employer]

## 2021-01-16 NOTE — Assessment & Plan Note (Signed)
At this point she is about 2 to 3 weeks post lateral malleolar fracture, overall doing okay. Getting some updated x-rays, continue boot for another 2 weeks. Return to see me in 2 weeks and we will likely transition her into an ASO she is nontender over her fracture.

## 2021-01-17 ENCOUNTER — Telehealth (INDEPENDENT_AMBULATORY_CARE_PROVIDER_SITE_OTHER): Payer: Medicare HMO | Admitting: Medical-Surgical

## 2021-01-17 ENCOUNTER — Encounter: Payer: Self-pay | Admitting: Medical-Surgical

## 2021-01-17 DIAGNOSIS — K5900 Constipation, unspecified: Secondary | ICD-10-CM

## 2021-01-17 DIAGNOSIS — K219 Gastro-esophageal reflux disease without esophagitis: Secondary | ICD-10-CM | POA: Diagnosis not present

## 2021-01-17 MED ORDER — FAMOTIDINE 20 MG PO TABS: 20.0000 mg | ORAL_TABLET | Freq: Two times a day (BID) | ORAL | 1 refills | Status: DC

## 2021-01-17 MED ORDER — POLYETHYLENE GLYCOL 3350 17 G PO PACK: 17.0000 g | PACK | Freq: Every day | ORAL | 11 refills | Status: DC | PRN

## 2021-01-17 NOTE — Progress Notes (Signed)
Virtual Visit via Video Note  I connected with Shannon Boyd on 01/17/21 at 11:10 AM EST by a video enabled telemedicine application and verified that I am speaking with the correct person using two identifiers.   I discussed the limitations of evaluation and management by telemedicine and the availability of in person appointments. The patient expressed understanding and agreed to proceed.  Patient location: home Provider locations: office  Subjective:    CC: GI concerns  HPI: Pleasant 35 year old female presenting via MyChart video visit to discuss current GI concerns. Patient taking Omeprazole every morning and famotidine at night. Feels the famotidine is longer lasting and gives better relief than the omeprazole and would like to try taking famotidine twice daily and stopping the omeprazole. No changes in diet and continues to avoid known triggers.   Has not had a bowel movement in about 3 days that she would consider normal. Did have a small one this morning but nowhere near her usual. Has had no changes in her diet or water intake and is not sure why she is having these issues. No home or OTC medications tried.   Past medical history, Surgical history, Family history not pertinant except as noted below, Social history, Allergies, and medications have been entered into the medical record, reviewed, and corrections made.   Review of Systems: See HPI for pertinent positives and negatives.   Objective:    General: Speaking clearly in complete sentences without any shortness of breath.  Alert and oriented x3.  Normal judgment. No apparent acute distress.  Impression and Recommendations:    1. Gastroesophageal reflux disease without esophagitis Discontinue omeprazole. Increase famotidine to 20mg  BID.  - famotidine (PEPCID) 20 MG tablet; Take 1 tablet (20 mg total) by mouth 2 (two) times daily.  Dispense: 180 tablet; Refill: 1  2. Constipation  Discussed increasing water and dietary  fiber intake. Can try Colace 100mg  BID if able to get it from the pharmacy. Miralax 17gm daily prn.   I discussed the assessment and treatment plan with the patient. The patient was provided an opportunity to ask questions and all were answered. The patient agreed with the plan and demonstrated an understanding of the instructions.   The patient was advised to call back or seek an in-person evaluation if the symptoms worsen or if the condition fails to improve as anticipated.  20 minutes of non-face-to-face time was provided during this encounter.  Return if symptoms worsen or fail to improve.  , DNP, APRN, FNP-BC [facility] and Sports Medicine

## 2021-01-21 ENCOUNTER — Telehealth: Payer: Self-pay

## 2021-01-21 NOTE — Telephone Encounter (Signed)
LVMTRC (1st attempt)    Pt called and LVM stating that she got a letter from [facility] saying that the walking boot she got during her 01/01/2021 OV with Ander Slade is not covered because a referral was not done. From what I understand from her VM and researching her chart, she was seen by [facility] for right ankle pain and Dr. Karie Schwalbe was consulted during that visit. During that same visit, she was seen by her PCP and then by Sports Med. I want to verify this is the correct information she was talking about. If it is, then a retro referral could possibly be done so her insurance will cover the boot she was given.

## 2021-01-22 ENCOUNTER — Other Ambulatory Visit: Payer: Self-pay | Admitting: Medical-Surgical

## 2021-01-22 DIAGNOSIS — R6889 Other general symptoms and signs: Secondary | ICD-10-CM | POA: Diagnosis not present

## 2021-01-22 NOTE — Telephone Encounter (Signed)
LVMTRC (2nd attempt)  

## 2021-01-22 NOTE — Telephone Encounter (Signed)
Spoke with pt who said that the letter said that she needed a referral to [facility] She provided a claim #[id number] and a telephone number to call [employer] at [phone]. I called [employer] and they said that her PCP needs to do a referral for her to be seen. I told her that she saw Christen Butter, FNP who is her PCP and [employer] said that she has Dr. Everrett Coombe listed as her PCP. I told them that she established with [facility] on 04/16/2020 and has never seen Dr. Ashley Royalty who is within our practice. I was told that the pt would need to call [facility] and give them the information and request a PCP change because when she signed up for the insurance she listed Dr. Ashley Royalty as her PCP. I called pt back and told her this and she said she should not have to call and have them change the information because we were in the same practice. I told her that she has established care with Joy at our practice and not with Dr. Ashley Royalty so until this information is updated she may have issues with getting claims paid in a timely manner and in the correct way. She verbalized understanding and said she would call [employer] and change her PCP with them.

## 2021-01-22 NOTE — Addendum Note (Signed)
Addended by: Thermon Leyland on: 01/22/2021 03:57 PM   Modules accepted: Orders

## 2021-01-23 ENCOUNTER — Ambulatory Visit (INDEPENDENT_AMBULATORY_CARE_PROVIDER_SITE_OTHER): Payer: Medicare HMO | Admitting: Licensed Clinical Social Worker

## 2021-01-23 DIAGNOSIS — F411 Generalized anxiety disorder: Secondary | ICD-10-CM

## 2021-01-23 DIAGNOSIS — F251 Schizoaffective disorder, depressive type: Secondary | ICD-10-CM | POA: Diagnosis not present

## 2021-01-23 DIAGNOSIS — F5102 Adjustment insomnia: Secondary | ICD-10-CM

## 2021-01-23 NOTE — Progress Notes (Signed)
.Virtual Visit via Telephone Note  I connected with Shannon Boyd on 01/23/21 at  9:00 AM EST by telephone and verified that I am speaking with the correct person using two identifiers.  Location: Patient: home Provider: home office   I discussed the limitations, risks, security and privacy concerns of performing an evaluation and management service by telephone and the availability of in person appointments. I also discussed with the patient that there may be a patient responsible charge related to this service. The patient expressed understanding and agreed to proceed.   I discussed the assessment and treatment plan with the patient. The patient was provided an opportunity to ask questions and all were answered. The patient agreed with the plan and demonstrated an understanding of the instructions.   The patient was advised to call back or seek an in-person evaluation if the symptoms worsen or if the condition fails to improve as anticipated.  I provided 50 minutes of non-face-to-face time during this encounter.   THERAPIST PROGRESS NOTE  Session Time: 9:00 AM to 9:50 AM  Participation Level: Active  Behavioral Response: CasualAlertAnxious and Euthymic  Type of Therapy: Individual Therapy  Treatment Goals addressed:  maintain stability with mood, learn and apply effective strategies to manage mood, coping  Interventions: Motivational Interviewing, Strength-based, Supportive and Other: coping  Summary: Shannon Boyd is a 35 y.o. female who presents with a lot going on very stressful. Had surgery one of the reason she lost weight was so she could have breast reduction surgery. In college depressed and put on a lot of weight. Also one of her mental health prescriptions made her eat so large when she graduated. After that unhappy with the way she looked. Even though lost weight didn't lose weight in chest and breast area. Went back to plastic surgery insurance approved. It was very fast  gave her a month to get it done. Broken her ankle two days before all happened at once. Broken ankle the insurance given her grief about things. The doctor is all in the same practice. Went with [employer] plan thinking it would help and it didn't. Primary care will file it through their name. Trying to handle it.  Patient shares Dr. In the same practice and therapist feedback was that should be good enough in terms of her primary care coming from the same practice additionally it was Dr. In the practice that referred her to a doctor that specializes in sports medicine which is giving her good care. Change back in April but not sure if they will pay for these medical services. Feels the surgery went really bad. Wasn't prepared how she came out. Wake up and had tubes in side didn't know what it was. Paper work and had to drain every day. Felt they let her go and left to deal with things on own. Sides where tubes are healed and follow up scheduled so that is good glad tubes out. Pleased to be smaller swelling on one of them. Taking Ibuprofen not sure if much change but happy to be healing. Blessed her friends because picking her up and taking her to appointment and grocery stores. Also neighbors patient said. Her ankle is the one she drives with so trying to get rides. Taking it well she thinks and her friends and the good folks in her world and prayer helps her turn it over because a lot can't control. Glad had appointment today really needed to talk. Celebrate Recovery helps her process feelings. Honest with them  and not happy with her love life. Explored what resonated with her and the input not to give hope still here and breathing and something can change. "One thing I learned is things can happen quickly." Still has a chance still here. Debates which one to do online or meet in public. Will has to be more assertive to do that. 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 "A dream that is too far way now." Hoping that she can be ready in mid March. Know her situation depends what doctor says. Even if don't work out keep going with substitutes. 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  Patient focused on the positive and this was one of them.  Therapist reviewed symptoms, facilitated expression of thoughts and feelings processed through patient's feelings related to significant stressors since this last therapy session.  Noted patient's strength in slowly getting past the stressors and also validated patient and how she was feeling.  Noted importance of her supports, noted helpfulness of her celebrate recovery where she gets to process through difficult feelings and get feedback that can be helpful in this case remaining helpful because there are good reasons to remain hopeful about finding someone.  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  Courage patient around strategies for dating and gave her praise and starting to be assertive but also not taking rejection personally equated to looking for jobs and the more you do it more opportunities you have as well as getting better at managing rejection.  Therapist provided active listening, open questions, supportive interventions  Suicidal/Homicidal: No  Plan: Return again in 6 weeks.2.Therapist work with patient on processing stressors and effective coping skills  Diagnosis: Axis I:  schizoaffective disorder, depressive type, generalized anxiety disorder, adjustment insomnia      Axis II: No diagnosis    Coolidge Breeze, LCSW 01/23/2021

## 2021-01-24 DIAGNOSIS — E669 Obesity, unspecified: Secondary | ICD-10-CM | POA: Diagnosis not present

## 2021-01-24 DIAGNOSIS — K219 Gastro-esophageal reflux disease without esophagitis: Secondary | ICD-10-CM | POA: Diagnosis not present

## 2021-01-24 DIAGNOSIS — Z6841 Body Mass Index (BMI) 40.0 and over, adult: Secondary | ICD-10-CM | POA: Diagnosis not present

## 2021-01-25 ENCOUNTER — Encounter: Payer: Self-pay | Admitting: Medical-Surgical

## 2021-01-25 ENCOUNTER — Ambulatory Visit (INDEPENDENT_AMBULATORY_CARE_PROVIDER_SITE_OTHER): Payer: Medicare HMO | Admitting: Medical-Surgical

## 2021-01-25 ENCOUNTER — Ambulatory Visit (INDEPENDENT_AMBULATORY_CARE_PROVIDER_SITE_OTHER): Payer: Medicare HMO | Admitting: Sports Medicine

## 2021-01-25 ENCOUNTER — Other Ambulatory Visit: Payer: Self-pay

## 2021-01-25 VITALS — BP 148/105 | HR 93 | Temp 98.4°F | Resp 20 | Ht 65.0 in | Wt 203.0 lb

## 2021-01-25 DIAGNOSIS — K219 Gastro-esophageal reflux disease without esophagitis: Secondary | ICD-10-CM

## 2021-01-25 DIAGNOSIS — K5900 Constipation, unspecified: Secondary | ICD-10-CM | POA: Diagnosis not present

## 2021-01-25 DIAGNOSIS — S8264XD Nondisplaced fracture of lateral malleolus of right fibula, subsequent encounter for closed fracture with routine healing: Secondary | ICD-10-CM

## 2021-01-25 DIAGNOSIS — Z8639 Personal history of other endocrine, nutritional and metabolic disease: Secondary | ICD-10-CM

## 2021-01-25 DIAGNOSIS — Z0000 Encounter for general adult medical examination without abnormal findings: Secondary | ICD-10-CM

## 2021-01-25 MED ORDER — DEXLANSOPRAZOLE 60 MG PO CPDR: 60.0000 mg | DELAYED_RELEASE_CAPSULE | Freq: Every day | ORAL | 1 refills | Status: DC

## 2021-01-25 NOTE — Assessment & Plan Note (Signed)
This pleasant 35 year old female had a lateral malleolus fracture approximately 5 weeks ago. She is doing well, she is nontender over the fracture, the most recent x-rays look great. I am transitioning her into just an Ace wrap with some Coban. Adding home rehab exercises, return to see me in a month.

## 2021-01-25 NOTE — Progress Notes (Signed)
    Procedures performed today:    None.  Independent interpretation of notes and tests performed by another provider:   None.  Brief History, Exam, Impression, and Recommendations:    Fracture of ankle, lateral malleolus, right, closed This pleasant 35 year old female had a lateral malleolus fracture approximately 5 weeks ago. She is doing well, she is nontender over the fracture, the most recent x-rays look great. I am transitioning her into just an Ace wrap with some Coban. Adding home rehab exercises, return to see me in a month.    ___________________________________________ Ihor Austin. Benjamin Stain, M.D., ABFM., CAQSM. Primary Care and Sports Medicine [facility]  Adjunct Instructor of Family Medicine  [employer]

## 2021-01-25 NOTE — Progress Notes (Signed)
Subjective:    CC: GERD/constipation and anhidrosis follow-up  HPI: Pleasant 35 year old female presenting today for the following:  GERD-she tried taking famotidine twice daily but this did not manage her symptoms well at all.  She has resumed omeprazole that she has at home in the morning and famotidine at night but her reflux is still pretty severe.  She has failed Nexium, Prilosec, Protonix, and omeprazole now.  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  Admits to eating quite a few of the foods on the list of foods to avoid when dealing with reflux.  Constipation-previously experiencing constipation but took MiraLAX which helped resolve the issue.  She does have an irregular bowel schedule, going 2-3 times in 1 day and then skipping a day or so.  Her bowel movements are soft, brown.  She does still have some left lower quadrant tenderness but a lot of her discomfort has passed.  Anhidrosis-notes that she has not resumed sweating as she did previously but is slightly less worried about this than before.  I reviewed the past medical history, family history, social history, surgical history, and allergies today and no changes were needed.  Please see the problem list section below in epic for further details.  Past Medical History: Past Medical History:  Diagnosis Date  . Anxiety   . Asthma   . Depression   . History of fracture of toe   . History of sprained ankle   . Hypertension   . Schizoaffective disorder [facility])    Past Surgical History: Past Surgical History:  Procedure Laterality Date  . ADENOIDECTOMY    . SALPINGECTOMY Bilateral   . WISDOM TOOTH EXTRACTION     Social History: Social History   Socioeconomic History  . Marital status: Single    Spouse name: Not on file  . Number of children: Not on file  . Years of education: Not on file  . Highest education level: Not on file  Occupational History  . Not on file  Tobacco Use  . Smoking status: Never Smoker  . Smokeless  tobacco: Never Used  Vaping Use  . Vaping Use: Never used  Substance and Sexual Activity  . Alcohol use: Yes    Comment: Rarely  . Drug use: No  . Sexual activity: Never  Other Topics Concern  . Not on file  Social History Narrative  . Not on file   Social Determinants of Health   Financial Resource Strain: Not on file  Food Insecurity: Not on file  Transportation Needs: Not on file  Physical Activity: Not on file  Stress: Not on file  Social Connections: Not on file   Family History: Family History  Problem Relation Age of Onset  . Depression Mother   . Anxiety disorder Mother   . Seizures Mother   . Breast cancer Mother   . Dementia Mother   . Cancer Mother        breast  . Schizophrenia Father   . Stroke Maternal Grandmother    Allergies: Allergies  Allergen Reactions  . Aripiprazole Swelling  . Codeine   . Doxycycline Rash  . Latuda [Lurasidone Hcl]   . Lurasidone Nausea And Vomiting       . Penicillins   . Pseudoeph-Bromphen-Dm     Other reaction(s): Unknown  . Resperal-Dm [Pseudoeph-Bromphen-Dm] Hives  . Risperidone And Related Hives  . Sulfa Antibiotics Other (See Comments)    Pt can't remember  . Topamax [Topiramate]   . Decadron [Dexamethasone] Itching and Rash  Medications: See med rec.  Review of Systems: See HPI for pertinent positives and negatives.   Objective:    General: Well Developed, well nourished, and in no acute distress.  Neuro: Alert and oriented x3.  HEENT: Normocephalic, atraumatic.  Skin: Warm and dry. Cardiac: Regular rate and rhythm, no murmurs rubs or gallops, no lower extremity edema.  Respiratory: Clear to auscultation bilaterally. Not using accessory muscles, speaking in full sentences. Abdomen: Soft, LLQ mildly tender, nondistended. Bowel sounds + x 4 quadrants. No HSM appreciated.   Impression and Recommendations:    1. Constipation, unspecified constipation type Continue using MiraLAX 17 g daily as needed for  constipation.  Okay to not have a bowel movement every day as long as she is having 3 BMs a week and her stools are not hard or hard to pass.  2. Gastroesophageal reflux disease without esophagitis Discontinue omeprazole.  Switching to Dexilant 60 mg daily.  Okay to continue nightly famotidine.  GERD foods to avoid information provided with AVS and reviewed verbally.  If this does not control her GERD, we will need to get her back in with GI for further evaluation peer  3. Preventative health care Due for labs per bariatrics.  We will go ahead and check CBC with differential, CMP, and lipid panel today. - CBC w/Diff/Platelet - COMPLETE METABOLIC PANEL WITH GFR - Lipid panel  4. History of vitamin D deficiency Checking vitamin D level today. - Vitamin D 1,25 dihydroxy  Return in about 4 weeks (around 02/22/2021) for gerd follow up. __________________________________________ Thayer Ohm, DNP, APRN, FNP-BC Primary Care and Sports Medicine [facility]

## 2021-01-25 NOTE — Patient Instructions (Signed)
Food Choices for Gastroesophageal Reflux Disease, Adult When you have gastroesophageal reflux disease (GERD), the foods you eat and your eating habits are very important. Choosing the right foods can help ease your discomfort. Think about working with a food expert (dietitian) to help you make good choices. What are tips for following this plan? Reading food labels  Look for foods that are low in saturated fat. Foods that may help with your symptoms include: ? Foods that have less than 5% of daily value (DV) of fat. ? Foods that have 0 grams of trans fat. Cooking  Do not fry your food.  Cook your food by baking, steaming, grilling, or broiling. These are all methods that do not need a lot of fat for cooking.  To add flavor, try to use herbs that are low in spice and acidity. Meal planning  Choose healthy foods that are low in fat, such as: ? Fruits and vegetables. ? Whole grains. ? Low-fat dairy products. ? Lean meats, fish, and poultry.  Eat small meals often instead of eating 3 large meals each day. Eat your meals slowly in a place where you are relaxed. Avoid bending over or lying down until 2-3 hours after eating.  Limit high-fat foods such as fatty meats or fried foods.  Limit your intake of fatty foods, such as oils, butter, and shortening.  Avoid the following as told by your doctor: ? Foods that cause symptoms. These may be different for different people. Keep a food diary to keep track of foods that cause symptoms. ? Alcohol. ? Drinking a lot of liquid with meals. ? Eating meals during the 2-3 hours before bed.   Lifestyle  Stay at a healthy weight. Ask your doctor what weight is healthy for you. If you need to lose weight, work with your doctor to do so safely.  Exercise for at least 30 minutes on 5 or more days each week, or as told by your doctor.  Wear loose-fitting clothes.  Do not smoke or use any products that contain nicotine or tobacco. If you need help  quitting, ask your doctor.  Sleep with the head of your bed higher than your feet. Use a wedge under the mattress or blocks under the bed frame to raise the head of the bed.  Chew sugar-free gum after meals. What foods should eat? Eat a healthy, well-balanced diet of fruits, vegetables, whole grains, low-fat dairy products, lean meats, fish, and poultry. Each person is different. Foods that may cause symptoms in one person may not cause any symptoms in another person. Work with your doctor to find foods that are safe for you. The items listed above may not be a complete list of what you can eat and drink. Contact a food expert for more options.   What foods should I avoid? Limiting some of these foods may help in managing the symptoms of GERD. Everyone is different. Talk with a food expert or your doctor to help you find the exact foods to avoid, if any. Fruits Any fruits prepared with added fat. Any fruits that cause symptoms. For some people, this may include citrus fruits, such as oranges, grapefruit, pineapple, and lemons. Vegetables Deep-fried vegetables. French fries. Any vegetables prepared with added fat. Any vegetables that cause symptoms. For some people, this may include tomatoes and tomato products, chili peppers, onions and garlic, and horseradish. Grains Pastries or quick breads with added fat. Meats and other proteins High-fat meats, such as fatty beef or pork,   hot dogs, ribs, ham, sausage, salami, and bacon. Fried meat or protein, including fried fish and fried chicken. Nuts and nut butters, in large amounts. Dairy Whole milk and chocolate milk. Sour cream. Cream. Ice cream. Cream cheese. Milkshakes. Fats and oils Butter. Margarine. Shortening. Ghee. Beverages Coffee and tea, with or without caffeine. Carbonated beverages. Sodas. Energy drinks. Fruit juice made with acidic fruits, such as orange or grapefruit. Tomato juice. Alcoholic drinks. Sweets and desserts Chocolate and  cocoa. Donuts. Seasonings and condiments Pepper. Peppermint and spearmint. Added salt. Any condiments, herbs, or seasonings that cause symptoms. For some people, this may include curry, hot sauce, or vinegar-based salad dressings. The items listed above may not be a complete list of what you should not eat and drink. Contact a food expert for more options. Questions to ask your doctor Diet and lifestyle changes are often the first steps that are taken to manage symptoms of GERD. If diet and lifestyle changes do not help, talk with your doctor about taking medicines. Where to find more information  International Foundation for Gastrointestinal Disorders: aboutgerd.org Summary  When you have GERD, food and lifestyle choices are very important in easing your symptoms.  Eat small meals often instead of 3 large meals a day. Eat your meals slowly and in a place where you are relaxed.  Avoid bending over or lying down until 2-3 hours after eating.  Limit high-fat foods such as fatty meats or fried foods. This information is not intended to replace advice given to you by your health care provider. Make sure you discuss any questions you have with your health care provider. Document Revised: 05/28/2020 Document Reviewed: 05/28/2020 Elsevier Patient Education  2021 Elsevier Inc.  

## 2021-01-28 ENCOUNTER — Telehealth (HOSPITAL_COMMUNITY): Payer: Self-pay | Admitting: Psychiatry

## 2021-01-28 ENCOUNTER — Ambulatory Visit: Payer: Medicare HMO | Admitting: Medical-Surgical

## 2021-01-28 MED ORDER — OXCARBAZEPINE 300 MG PO TABS: 300.0000 mg | ORAL_TABLET | Freq: Two times a day (BID) | ORAL | 1 refills | Status: DC

## 2021-01-28 NOTE — Telephone Encounter (Signed)
Ok I just sent to [facility] pharmacy, let me know if for some reason they are not getting it

## 2021-01-28 NOTE — Telephone Encounter (Signed)
Per pt Pt needs refill on trileptal sent to [facility] pharmacy does not have the rx ( I have called and verified this)  Crystal is not in the office today.   [facility] and [facility] will [facility].  Please advise.

## 2021-02-04 ENCOUNTER — Telehealth (INDEPENDENT_AMBULATORY_CARE_PROVIDER_SITE_OTHER): Payer: Medicare Other | Admitting: Psychiatry

## 2021-02-04 ENCOUNTER — Encounter: Payer: Self-pay | Admitting: Medical-Surgical

## 2021-02-04 ENCOUNTER — Encounter (HOSPITAL_COMMUNITY): Payer: Self-pay | Admitting: Psychiatry

## 2021-02-04 DIAGNOSIS — F411 Generalized anxiety disorder: Secondary | ICD-10-CM

## 2021-02-04 DIAGNOSIS — F251 Schizoaffective disorder, depressive type: Secondary | ICD-10-CM

## 2021-02-04 MED ORDER — BENZTROPINE MESYLATE 0.5 MG PO TABS: 0.5000 mg | ORAL_TABLET | Freq: Every day | ORAL | 0 refills | Status: DC

## 2021-02-04 MED ORDER — PERPHENAZINE 4 MG PO TABS: 8.0000 mg | ORAL_TABLET | Freq: Every day | ORAL | 1 refills | Status: DC

## 2021-02-04 MED ORDER — OXCARBAZEPINE 300 MG PO TABS: 300.0000 mg | ORAL_TABLET | Freq: Every day | ORAL | 1 refills | Status: DC

## 2021-02-04 NOTE — Progress Notes (Signed)
[facility] Follow up visit  Patient Identification: Shannon Boyd MRN:  [id number] Date of Evaluation:  02/04/2021 Referral Source: primary care. Also by our Therapist [facility] Chief Complaint:    follow up med review  Visit Diagnosis:    ICD-10-CM   1. Schizoaffective disorder, depressive type (HCC)  F25.1   2. GAD (generalized anxiety disorder)  F41.1       Virtual Visit via Video Note  I connected with Terralyn Gavin on 02/04/21 at  9:15 AM EST by a video enabled telemedicine application and verified that I am speaking with the correct person using two identifiers.  Location: Patient: home Provider: home office   I discussed the limitations of evaluation and management by telemedicine and the availability of in person appointments. The patient expressed understanding and agreed to proceed.     I discussed the assessment and treatment plan with the patient. The patient was provided an opportunity to ask questions and all were answered. The patient agreed with the plan and demonstrated an understanding of the instructions.   The patient was advised to call back or seek an in-person evaluation if the symptoms worsen or if the condition fails to improve as anticipated.  I provided 15  minutes of non-face-to-face time during this encounter.      History of Present Illness: Patient is a 35 years old currently single Caucasian female is living with her mom she is currently on disability for schizophrenia, obesity and other diagnoses.  Has been part of [employer] before   Having some sedation or feeling foggy, forgetful during the day Has to take care of mom,  also had recent surgery Reviewed meds, considered to lower trileptal  Aggravating factor: recent surgery, difficult childhood and dealing with mom. lonliness Modifying factor: housing Duration since young age Drug use : denies  Past psych meds: Latuda, risperdal. abilify . Had side effects   Past Psychiatric History: schizoaffective  disorder  Previous Psychotropic Medications: Yes   Substance Abuse History in the last 12 months:  No.  Consequences of Substance Abuse: NA  Past Medical History:  Past Medical History:  Diagnosis Date  . Anxiety   . Asthma   . Depression   . History of fracture of toe   . History of sprained ankle   . Hypertension   . Schizoaffective disorder [facility])     Past Surgical History:  Procedure Laterality Date  . ADENOIDECTOMY    . SALPINGECTOMY Bilateral   . WISDOM TOOTH EXTRACTION      Family Psychiatric History: Mom told patient . Her dad had schizophrenia  Family History:  Family History  Problem Relation Age of Onset  . Depression Mother   . Anxiety disorder Mother   . Seizures Mother   . Breast cancer Mother   . Dementia Mother   . Cancer Mother        breast  . Schizophrenia Father   . Stroke Maternal Grandmother     Social History:   Social History   Socioeconomic History  . Marital status: Single    Spouse name: Not on file  . Number of children: Not on file  . Years of education: Not on file  . Highest education level: Not on file  Occupational History  . Not on file  Tobacco Use  . Smoking status: Never Smoker  . Smokeless tobacco: Never Used  Vaping Use  . Vaping Use: Never used  Substance and Sexual Activity  . Alcohol use: Yes    Comment:  Rarely  . Drug use: No  . Sexual activity: Never  Other Topics Concern  . Not on file  Social History Narrative  . Not on file   Social Determinants of Health   Financial Resource Strain: Not on file  Food Insecurity: Not on file  Transportation Needs: Not on file  Physical Activity: Not on file  Stress: Not on file  Social Connections: Not on file       Allergies:   Allergies  Allergen Reactions  . Aripiprazole Swelling  . Codeine   . Doxycycline Rash  . Latuda [Lurasidone Hcl]   . Lurasidone Nausea And Vomiting       . Penicillins   . Pseudoeph-Bromphen-Dm     Other reaction(s):  Unknown  . Resperal-Dm [Pseudoeph-Bromphen-Dm] Hives  . Risperidone And Related Hives  . Sulfa Antibiotics Other (See Comments)    Pt can't remember  . Topamax [Topiramate]   . Decadron [Dexamethasone] Itching and Rash    Metabolic Disorder Labs: No results found for: HGBA1C, MPG No results found for: PROLACTIN No results found for: CHOL, TRIG, HDL, CHOLHDL, VLDL, LDLCALC No results found for: TSH  Therapeutic Level Labs: No results found for: LITHIUM No results found for: CBMZ No results found for: VALPROATE  Current Medications: Current Outpatient Medications  Medication Sig Dispense Refill  . benztropine (COGENTIN) 0.5 MG tablet Take 1 tablet (0.5 mg total) by mouth daily. 30 tablet 0  . Cholecalciferol (VITAMIN D3) 50 MCG (2000 UT) capsule Take 2,000 Units by mouth daily.    Marland Kitchen dexlansoprazole (DEXILANT) 60 MG capsule Take 1 capsule (60 mg total) by mouth daily. 30 capsule 1  . famotidine (PEPCID) 20 MG tablet Take 1 tablet (20 mg total) by mouth 2 (two) times daily. 180 tablet 1  . ibuprofen (ADVIL) 800 MG tablet Take 1 tablet (800 mg total) by mouth every 8 (eight) hours as needed. 90 tablet 2  . melatonin 1 MG TABS tablet Take 1-2 mg by mouth at bedtime as needed.     . Oxcarbazepine (TRILEPTAL) 300 MG tablet Take 1 tablet (300 mg total) by mouth at bedtime. 30 tablet 1  . perphenazine (TRILAFON) 4 MG tablet Take 2 tablets (8 mg total) by mouth at bedtime. 60 tablet 1  . sodium fluoride (FLUORISHIELD) 1.1 % GEL dental gel Denta 5000 Plus 1.1 % cream    . venlafaxine XR (EFFEXOR-XR) 37.5 MG 24 hr capsule TAKE 3 CAPSULES (112.5 MG TOTAL) BY MOUTH DAILY WITH BREAKFAST. 270 capsule 0   No current facility-administered medications for this visit.       Psychiatric Specialty Exam: Review of Systems  Cardiovascular: Negative for chest pain.  Psychiatric/Behavioral: Negative for substance abuse and suicidal ideas.    Last menstrual period 01/05/2021.There is no height or  weight on file to calculate BMI.  General Appearance:   Eye Contact:    Speech:  Normal Rate  Volume:  Decreased  Mood: somewhat subdued  Affect:  Constricted  Thought Process:  Goal Directed  Orientation:  Full (Time, Place, and Person)  Thought Content:  Rumination  Suicidal Thoughts:  No  Homicidal Thoughts:  No  Memory:  Immediate;   Fair Recent;   Fair  Judgement:  Fair  Insight:  Shallow  Psychomotor Activity:  Normal  Concentration:  Concentration: Fair and Attention Span: Fair  Recall:  [employer] of Knowledge:Fair  Language: Fair  Akathisia:  No  Handed:  Right  AIMS (if indicated):  No involuntary movements  Assets:  Desire for Improvement Financial Resources/Insurance Leisure Time Physical Health  ADL's:  Intact  Cognition: WNL  Sleep:  Fair   Screenings: GAD-7   Flowsheet Row Office Visit from 01/25/2021 in [facility] Office Visit from 04/16/2020 in [facility]  Total GAD-7 Score 5 5    PHQ2-9   Flowsheet Row Office Visit from 01/25/2021 in [facility] Office Visit from 04/16/2020 in [facility]  PHQ-2 Total Score 1 2  PHQ-9 Total Score 5 9    Flowsheet Row Video Visit from 02/04/2021 in [facility] [place] Video Visit from 01/11/2021 in [facility] AT [place]  C-SSRS RISK CATEGORY No Risk No Risk      Assessment and Plan: as follows Schizoaffective disorder, not hallucinating, discussed meds, will lower trileptal as she is on multiple meds, continue prolxiin, cogentin was lowered last visit  GAD: general worries, fluctuates, continue effexor Insomnia: reviewed sleep hygiene to have more consolidated sleep at night Fu 40m. Or earlier if needed    Thresa Ross, MD 3/7/20229:35 AM

## 2021-02-05 ENCOUNTER — Telehealth (HOSPITAL_COMMUNITY): Payer: Self-pay | Admitting: Psychiatry

## 2021-02-05 NOTE — Telephone Encounter (Signed)
Yes she can go back to bid. Prefer we make fu in person Tuesday around her schedule date .  Call back for concerns

## 2021-02-05 NOTE — Telephone Encounter (Signed)
Pt calling She is very tearful. Would like to increase her dose of trileptal.  Please advise

## 2021-02-06 ENCOUNTER — Encounter: Payer: Self-pay | Admitting: Medical-Surgical

## 2021-02-06 ENCOUNTER — Other Ambulatory Visit: Payer: Self-pay

## 2021-02-06 ENCOUNTER — Ambulatory Visit (INDEPENDENT_AMBULATORY_CARE_PROVIDER_SITE_OTHER): Payer: Medicare Other | Admitting: Medical-Surgical

## 2021-02-06 ENCOUNTER — Ambulatory Visit (INDEPENDENT_AMBULATORY_CARE_PROVIDER_SITE_OTHER): Payer: Medicare Other | Admitting: Sports Medicine

## 2021-02-06 ENCOUNTER — Ambulatory Visit (INDEPENDENT_AMBULATORY_CARE_PROVIDER_SITE_OTHER): Payer: Medicare Other

## 2021-02-06 VITALS — BP 180/125 | HR 103

## 2021-02-06 DIAGNOSIS — K219 Gastro-esophageal reflux disease without esophagitis: Secondary | ICD-10-CM

## 2021-02-06 DIAGNOSIS — K5900 Constipation, unspecified: Secondary | ICD-10-CM | POA: Diagnosis not present

## 2021-02-06 DIAGNOSIS — F251 Schizoaffective disorder, depressive type: Secondary | ICD-10-CM

## 2021-02-06 DIAGNOSIS — S8264XD Nondisplaced fracture of lateral malleolus of right fibula, subsequent encounter for closed fracture with routine healing: Secondary | ICD-10-CM

## 2021-02-06 DIAGNOSIS — F329 Major depressive disorder, single episode, unspecified: Secondary | ICD-10-CM

## 2021-02-06 NOTE — Assessment & Plan Note (Addendum)
This patient propositioned me in the exam room in the visit today, I requested we keep things professional, and she became tearful. I am concerned this is a delusion related to her schizoaffective disorder.  I would prefer a chaperone for future visits with this patient.

## 2021-02-06 NOTE — Telephone Encounter (Signed)
Informed patient of everything Dr. Akhtar stated in previous message. She stated her understanding. 

## 2021-02-06 NOTE — Progress Notes (Signed)
Subjective:    CC: stomach issues  HPI: 35 year old female presenting today to discuss stomach issues. Has been treated for reflux since her teens and has tried multiple regimens without much control. Since her gastric bypass surgery, has continued to have issues with severe reflux refractory to medications and dietary modifications. Had an endoscopy in the past but is not sure when that was.   Just prior to our appointment, she saw Dr. Karie Schwalbe for a follow up on her ankle. During that appointment, she propositioned him. When he advised her that their relationship is strictly professional and will remain that way, she became very upset. During the rooming process for this appointment, patient was witnessed by Ihor Gully, MA saying: "I don't want to do the questionnaire" (PHQ9/GAD7) "I don't want to go to the hospital" "My psychiatrist does not have me on the right meds" "I'm scared I'm going to hurt myself" "I don't want to be alone" "These voices are telling me to kill myself" She is currently managed by Dr. Gilmore Laroche for psychiatry and is seeing [facility] for counseling. She is taking her medications as prescribed and saw Dr. Gilmore Laroche on 3/7 with telephone correspondence with his office yesterday regarding an increase of the trileptal dose. She notes that she is having difficulty thinking and concentrating. Very tearful and frustrated during our discussion. Denies the above statements, especially the fear of hurting herself and hearing voices. Feels that the big issue is that she is alone and wants to be in a relationship. Her biggest concern today is that she doesn't want to go to the hospital and states the reason is because she has plans and things she wants to do. Positive history of suicide attempts with last stated occurring in 2017. Difficulty following along with conversation, mishearing multiple words and asking for clarification for simple phrases.   I reviewed the past medical history,  family history, social history, surgical history, and allergies today and no changes were needed.  Please see the problem list section below in epic for further details.  Past Medical History: Past Medical History:  Diagnosis Date  . Anxiety   . Asthma   . Depression   . History of fracture of toe   . History of sprained ankle   . Hypertension   . Schizoaffective disorder [facility])    Past Surgical History: Past Surgical History:  Procedure Laterality Date  . ADENOIDECTOMY    . SALPINGECTOMY Bilateral   . WISDOM TOOTH EXTRACTION     Social History: Social History   Socioeconomic History  . Marital status: Single    Spouse name: Not on file  . Number of children: Not on file  . Years of education: Not on file  . Highest education level: Not on file  Occupational History  . Not on file  Tobacco Use  . Smoking status: Never Smoker  . Smokeless tobacco: Never Used  Vaping Use  . Vaping Use: Never used  Substance and Sexual Activity  . Alcohol use: Yes    Comment: Rarely  . Drug use: No  . Sexual activity: Never  Other Topics Concern  . Not on file  Social History Narrative  . Not on file   Social Determinants of Health   Financial Resource Strain: Not on file  Food Insecurity: Not on file  Transportation Needs: Not on file  Physical Activity: Not on file  Stress: Not on file  Social Connections: Not on file   Family History: Family History  Problem  Relation Age of Onset  . Depression Mother   . Anxiety disorder Mother   . Seizures Mother   . Breast cancer Mother   . Dementia Mother   . Cancer Mother        breast  . Schizophrenia Father   . Stroke Maternal Grandmother    Allergies: Allergies  Allergen Reactions  . Aripiprazole Swelling  . Codeine   . Doxycycline Rash  . Latuda [Lurasidone Hcl]   . Lurasidone Nausea And Vomiting       . Penicillins   . Pseudoeph-Bromphen-Dm     Other reaction(s): Unknown  . Resperal-Dm [Pseudoeph-Bromphen-Dm]  Hives  . Risperidone And Related Hives  . Sulfa Antibiotics Other (See Comments)    Pt can't remember  . Topamax [Topiramate]   . Decadron [Dexamethasone] Itching and Rash   Medications: See med rec.  Review of Systems: See HPI for pertinent positives and negatives.   Objective:    General: Well Developed, well nourished, tearful, agitated.  Neuro: Alert and oriented x3.  HEENT: Normocephalic, atraumatic.  Skin: Warm and dry. Cardiac: Regular rate and rhythm.  Respiratory: Not using accessory muscles, speaking in full sentences.  Impression and Recommendations:    1. Gastroesophageal reflux disease without esophagitis/Constipation Since she has tried and failed multiple approaches, referring to GI. She will likely need an endoscopy for further evaluation. For now, continue current regimen. As she has been treated for reflux issues since her teens, she will likely need to be on a medication long-term but will need to find the right one. - Ambulatory referral to Gastroenterology  2. Schizoaffective disorder, depressive type (HCC)/Major depressive disorder, remission status unspecified, unspecified whether recurrent Currently in psychiatric crisis with witnessed verbalization of auditory hallucinations and thoughts of self harm. Patient attempted to contract for safety but this provider felt that her statements were not sincere. Offered patient the option to be taken to the psychiatric hospital via EMS of drive herself there. Patient adamantly refusing to go via EMS but did agree to go the the behavioral health urgent care in [place]. Advised that we will call to check in after 1 hour and if not at the hospital will send the police to her home for a wellness check and/or escort to the behavioral health hospital.   Addendum:  After lunch, check to patient's chart for check-in at behavioral health urgent care or the [facility] health behavioral hospital.  No information to indicate she had  checked in.  Contacted the behavioral health hospital at [facility] with verification that she did not check in.  Attempted to contact patient on the 2 numbers listed in the chart but no answer.  Voicemails were left on each number.  Patient called back several minutes later and notes that she did not go to the hospital after all.  Instead she chose to go home.  Reports that she is feeling fine now and she is over the "meltdown" that she had in our office.  Discussed ensuring her safety and the need for intervention with her medications if this is not keeping her on a stable basis.  Her mom lives with her and was an ICU nurse so she is very aware of the monitoring needs.  Reckon did provide verbal permission to speak with her mother regarding today's events.  After a brief conversation, an agreement was reached for her to remain home under her mothers care and contact her psychiatrist as soon as possible for reevaluation of her medication regimen.  If any signs of self-harm, suicidal ideation, homicidal ideation, or crisis return, please take her to the nearest behavioral health urgent care facility or behavioral health hospital for immediate evaluation.  Mother and patient both verbalized understanding and are agreeable to the plan.  Return for mood follow up in 3-4 weeks. ___________________________________________ Thayer Ohm, DNP, APRN, FNP-BC Primary Care and Sports Medicine [facility] [place]

## 2021-02-06 NOTE — Assessment & Plan Note (Addendum)
This is a 35 year old female with schizoaffective disorder, approximately 7-1/2 weeks ago she had a lateral malleolus fracture, she is doing well, nontender over the fracture, we got some additional x-rays today. They look good as well, I can no longer see the fracture line. She can get out of the Ace wrap, continue range of motion exercises and do activities as tolerated. Return to see me on an as-needed basis.

## 2021-02-06 NOTE — Progress Notes (Signed)
    Procedures performed today:    None.  Independent interpretation of notes and tests performed by another provider:   X-rays personally reviewed, fracture line is no longer visible.  Brief History, Exam, Impression, and Recommendations:    Fracture of ankle, lateral malleolus, right, closed This is a 35 year old female with schizoaffective disorder, approximately 7-1/2 weeks ago she had a lateral malleolus fracture, she is doing well, nontender over the fracture, we got some additional x-rays today. They look good as well, I can no longer see the fracture line. She can get out of the Ace wrap, continue range of motion exercises and do activities as tolerated. Return to see me on an as-needed basis.  Schizoaffective disorder This patient propositioned me in the exam room in the visit today, I requested we keep things professional, and she became tearful. I am concerned this is a delusion related to her schizoaffective disorder.  I would prefer a chaperone for future visits with this patient.    ___________________________________________ Ihor Austin. Benjamin Stain, M.D., ABFM., CAQSM. Primary Care and Sports Medicine [facility]  Adjunct Instructor of Family Medicine  [employer]

## 2021-02-08 ENCOUNTER — Encounter: Payer: Self-pay | Admitting: Medical-Surgical

## 2021-02-09 LAB — COMPLETE METABOLIC PANEL WITH GFR
AG Ratio: 1.7 (calc) (ref 1.0–2.5)
ALT: 16 U/L (ref 6–29)
AST: 12 U/L (ref 10–30)
Albumin: 4 g/dL (ref 3.6–5.1)
Alkaline phosphatase (APISO): 110 U/L (ref 31–125)
BUN: 7 mg/dL (ref 7–25)
CO2: 26 mmol/L (ref 20–32)
Calcium: 9 mg/dL (ref 8.6–10.2)
Chloride: 101 mmol/L (ref 98–110)
Creat: 0.52 mg/dL (ref 0.50–1.10)
GFR, Est African American: 144 mL/min/{1.73_m2} (ref >=60)
GFR, Est Non African American: 125 mL/min/{1.73_m2} (ref >=60)
Globulin: 2.4 g/dL (calc) (ref 1.9–3.7)
Glucose, Bld: 84 mg/dL (ref 65–99)
Potassium: 3.8 mmol/L (ref 3.5–5.3)
Sodium: 136 mmol/L (ref 135–146)
Total Bilirubin: 0.4 mg/dL (ref 0.2–1.2)
Total Protein: 6.4 g/dL (ref 6.1–8.1)

## 2021-02-09 LAB — CBC WITH DIFFERENTIAL/PLATELET
Absolute Monocytes: 462 cells/uL (ref 200–950)
Basophils Absolute: 20 cells/uL (ref 0–200)
Basophils Relative: 0.3 %
Eosinophils Absolute: 40 cells/uL (ref 15–500)
Eosinophils Relative: 0.6 %
HCT: 41.1 % (ref 35.0–45.0)
Hemoglobin: 14.2 g/dL (ref 11.7–15.5)
Lymphs Abs: 1828 cells/uL (ref 850–3900)
MCH: 32.1 pg (ref 27.0–33.0)
MCHC: 34.5 g/dL (ref 32.0–36.0)
MCV: 92.8 fL (ref 80.0–100.0)
MPV: 10.1 fL (ref 7.5–12.5)
Monocytes Relative: 7 %
Neutro Abs: 4250 cells/uL (ref 1500–7800)
Neutrophils Relative %: 64.4 %
Platelets: 325 10*3/uL (ref 140–400)
RBC: 4.43 10*6/uL (ref 3.80–5.10)
RDW: 11.7 % (ref 11.0–15.0)
Total Lymphocyte: 27.7 %
WBC: 6.6 10*3/uL (ref 3.8–10.8)

## 2021-02-09 LAB — VITAMIN D 1,25 DIHYDROXY
Vitamin D 1, 25 (OH)2 Total: 56 pg/mL (ref 18–72)
Vitamin D2 1, 25 (OH)2: <8 pg/mL
Vitamin D3 1, 25 (OH)2: 56 pg/mL

## 2021-02-09 LAB — LIPID PANEL
Cholesterol: 156 mg/dL (ref <200)
HDL: 49 mg/dL — ABNORMAL LOW (ref >=50)
LDL Cholesterol (Calc): 94 mg/dL (calc)
Non-HDL Cholesterol (Calc): 107 mg/dL (calc) (ref <130)
Total CHOL/HDL Ratio: 3.2 (calc) (ref <5.0)
Triglycerides: 43 mg/dL (ref <150)

## 2021-02-13 ENCOUNTER — Telehealth (INDEPENDENT_AMBULATORY_CARE_PROVIDER_SITE_OTHER): Payer: Medicare Other | Admitting: Psychiatry

## 2021-02-13 ENCOUNTER — Encounter (HOSPITAL_COMMUNITY): Payer: Self-pay | Admitting: Psychiatry

## 2021-02-13 DIAGNOSIS — F411 Generalized anxiety disorder: Secondary | ICD-10-CM | POA: Diagnosis not present

## 2021-02-13 DIAGNOSIS — F251 Schizoaffective disorder, depressive type: Secondary | ICD-10-CM

## 2021-02-13 MED ORDER — OXCARBAZEPINE 300 MG PO TABS: 300.0000 mg | ORAL_TABLET | Freq: Two times a day (BID) | ORAL | 0 refills | Status: AC

## 2021-02-13 MED ORDER — PERPHENAZINE 4 MG PO TABS: 4.0000 mg | ORAL_TABLET | Freq: Three times a day (TID) | ORAL | 0 refills | Status: DC

## 2021-02-13 NOTE — Progress Notes (Signed)
[facility] Follow up visit  Patient Identification: Shannon Boyd MRN:  [id number] Date of Evaluation:  02/13/2021 Referral Source: primary care. Also by our Therapist [facility] Chief Complaint:    follow up med review  Visit Diagnosis:    ICD-10-CM   1. Schizoaffective disorder, depressive type (HCC)  F25.1   2. GAD (generalized anxiety disorder)  F41.1     Virtual Visit via Video Note  I connected with Shannon Boyd on 02/13/21 at  8:45 AM EDT by a video enabled telemedicine application and verified that I am speaking with the correct person using two identifiers.  Location: Patient: home Provider: home office   I discussed the limitations of evaluation and management by telemedicine and the availability of in person appointments. The patient expressed understanding and agreed to proceed.    Follow Up Instructions:    I discussed the assessment and treatment plan with the patient. The patient was provided an opportunity to ask questions and all were answered. The patient agreed with the plan and demonstrated an understanding of the instructions.   The patient was advised to call back or seek an in-person evaluation if the symptoms worsen or if the condition fails to improve as anticipated.  I provided 15  minutes of non-face-to-face time during this encounter.    History of Present Illness: Patient is a 35 years old currently single Caucasian female is living with her mom she is currently on disability for schizophrenia and other diagnoses.  Has been part of [facility] before   This is an early visit Last visit was on 3/7 and was feeling foggy and meds were adjusted . She has cut down her meds prior after surgery was taking lower dose of prolixin On 3/8 we had increased trileptal to bid after phone call  Incident happened with primary care office 3/9 I reviewed notes today, she said did not know primary care doctor was not single and she should not have done that communication. Had not  been informed of this or any contact prior to today. She was suggested to get admitted but she refused and said was not suicidal.  That had upsetted patient she has gradually done better according to history given and has scheduled appointment with therapist [facility] . She is now taking trileptal bid and prolixin back to 4mg  during the day and 8mg  at night, discussed compliance and not to lower the med She was unhappy that she did not get message back from front desk or communication on 3/9 says has left message.  Mood wise doing some better, has engaged in activities and helps mom out.  Did not endorse fogginess today.  Does not endorse suicidal or harmful thoughts to others  Aggravating factor: recent surgery, difficult childhood and dealing with mom. Recent incident at primary care office, Modifying factor: housing Duration since young age Drug use : denies  Past psych meds: Latuda, risperdal. abilify . Had side effects   Past Psychiatric History: schizoaffective disorder  Previous Psychotropic Medications: Yes   Substance Abuse History in the last 12 months:  No.  Consequences of Substance Abuse: NA  Past Medical History:  Past Medical History:  Diagnosis Date  . Anxiety   . Asthma   . Depression   . History of fracture of toe   . History of sprained ankle   . Hypertension   . Schizoaffective disorder [facility])     Past Surgical History:  Procedure Laterality Date  . ADENOIDECTOMY    . SALPINGECTOMY Bilateral   .  WISDOM TOOTH EXTRACTION      Family Psychiatric History: Mom told patient . Her dad had schizophrenia  Family History:  Family History  Problem Relation Age of Onset  . Depression Mother   . Anxiety disorder Mother   . Seizures Mother   . Breast cancer Mother   . Dementia Mother   . Cancer Mother        breast  . Schizophrenia Father   . Stroke Maternal Grandmother     Social History:   Social History   Socioeconomic History  . Marital status:  Single    Spouse name: Not on file  . Number of children: Not on file  . Years of education: Not on file  . Highest education level: Not on file  Occupational History  . Not on file  Tobacco Use  . Smoking status: Never Smoker  . Smokeless tobacco: Never Used  Vaping Use  . Vaping Use: Never used  Substance and Sexual Activity  . Alcohol use: Yes    Comment: Rarely  . Drug use: No  . Sexual activity: Never  Other Topics Concern  . Not on file  Social History Narrative  . Not on file   Social Determinants of Health   Financial Resource Strain: Not on file  Food Insecurity: Not on file  Transportation Needs: Not on file  Physical Activity: Not on file  Stress: Not on file  Social Connections: Not on file       Allergies:   Allergies  Allergen Reactions  . Aripiprazole Swelling  . Codeine   . Doxycycline Rash  . Latuda [Lurasidone Hcl]   . Lurasidone Nausea And Vomiting       . Penicillins   . Pseudoeph-Bromphen-Dm     Other reaction(s): Unknown  . Resperal-Dm [Pseudoeph-Bromphen-Dm] Hives  . Risperidone And Related Hives  . Sulfa Antibiotics Other (See Comments)    Pt can't remember  . Topamax [Topiramate]   . Decadron [Dexamethasone] Itching and Rash    Metabolic Disorder Labs: No results found for: HGBA1C, MPG No results found for: PROLACTIN Lab Results  Component Value Date   CHOL 156 02/05/2021   TRIG 43 02/05/2021   HDL 49 (L) 02/05/2021   CHOLHDL 3.2 02/05/2021   LDLCALC 94 02/05/2021   No results found for: TSH  Therapeutic Level Labs: No results found for: LITHIUM No results found for: CBMZ No results found for: VALPROATE  Current Medications: Current Outpatient Medications  Medication Sig Dispense Refill  . benztropine (COGENTIN) 0.5 MG tablet Take 1 tablet (0.5 mg total) by mouth daily. 30 tablet 0  . Cholecalciferol (VITAMIN D3) 50 MCG (2000 UT) capsule Take 2,000 Units by mouth daily.    Marland Kitchen ibuprofen (ADVIL) 800 MG tablet Take 1  tablet (800 mg total) by mouth every 8 (eight) hours as needed. 90 tablet 2  . melatonin 1 MG TABS tablet Take 1-2 mg by mouth at bedtime as needed.     . Oxcarbazepine (TRILEPTAL) 300 MG tablet Take 1 tablet (300 mg total) by mouth at bedtime. 30 tablet 1  . perphenazine (TRILAFON) 4 MG tablet Take 2 tablets (8 mg total) by mouth at bedtime. 60 tablet 1  . sodium fluoride (FLUORISHIELD) 1.1 % GEL dental gel Denta 5000 Plus 1.1 % cream    . venlafaxine XR (EFFEXOR-XR) 37.5 MG 24 hr capsule TAKE 3 CAPSULES (112.5 MG TOTAL) BY MOUTH DAILY WITH BREAKFAST. 270 capsule 0   No current facility-administered medications for this visit.  Psychiatric Specialty Exam: Review of Systems  Cardiovascular: Negative for chest pain.  Psychiatric/Behavioral: Negative for substance abuse and suicidal ideas.    Last menstrual period 02/06/2021.There is no height or weight on file to calculate BMI.  General Appearance:   Eye Contact:    Speech:  Normal Rate  Volume:  Decreased  Mood: subdued but not hopeless or suicidal   Affect:  Constricted  Thought Process:  Goal Directed  Orientation:  Full (Time, Place, and Person)  Thought Content:  Rumination  Suicidal Thoughts:  No  Homicidal Thoughts:  No  Memory:  Immediate;   Fair Recent;   Fair  Judgement:  Fair  Insight:  Shallow  Psychomotor Activity:  Normal  Concentration:  Concentration: Fair and Attention Span: Fair  Recall:  [employer] of Knowledge:Fair  Language: Fair  Akathisia:  No  Handed:  Right  AIMS (if indicated):  No involuntary movements  Assets:  Desire for Improvement Financial Resources/Insurance Leisure Time Physical Health  ADL's:  Intact  Cognition: WNL  Sleep:  Fair   Screenings: GAD-7   Flowsheet Row Office Visit from 01/25/2021 in [facility] Office Visit from 04/16/2020 in [facility]  Total GAD-7 Score 5 5    PHQ2-9   Flowsheet Row  Office Visit from 01/25/2021 in [facility] Office Visit from 04/16/2020 in [facility]  PHQ-2 Total Score 1 2  PHQ-9 Total Score 5 9    Flowsheet Row Video Visit from 02/04/2021 in [facility] [place] Video Visit from 01/11/2021 in [facility] AT [place]  C-SSRS RISK CATEGORY No Risk No Risk      Assessment and Plan: as follows Schizoaffective disorder, subdued, denies paranoia, denies any delusional concerns.  Have increased prolixin to 4mg  qd and 8mg  at night from past dose of 8mg  at night only. Also taking trileptal bid instead of past dose of qhs only. All other meds remains the same and doing better, still upset about incident and will follow with therapist as well. Provided supportive therapy and discussed communication to front office and her concerns  GAD: worriful, provided supportive therapy, some better over last few days, scheduled therpay, continue effexor  She was upset at beginning but calmer and plans to continue services with above changes, to call earlier if needed and discussed compliance with meds, doses and therapy She wants frequent therapy as well Have informed admin staff   Fu 4 weeks or earlier if needed   , MD 3/16/20229:19 AM

## 2021-02-18 ENCOUNTER — Other Ambulatory Visit: Payer: Self-pay | Admitting: Medical-Surgical

## 2021-02-18 ENCOUNTER — Other Ambulatory Visit (HOSPITAL_COMMUNITY): Payer: Self-pay | Admitting: Psychiatry

## 2021-02-22 ENCOUNTER — Telehealth: Payer: Medicare HMO | Admitting: Medical-Surgical

## 2021-02-22 ENCOUNTER — Ambulatory Visit: Payer: Medicare HMO | Admitting: Sports Medicine

## 2021-02-27 ENCOUNTER — Telehealth (HOSPITAL_COMMUNITY): Payer: Self-pay | Admitting: Psychiatry

## 2021-02-27 NOTE — Telephone Encounter (Signed)
Mother- Leonie Douglas called-  We do not have a release to speak with mother.  I did not confirm or deny if this was a patient of ours.   Patient is staying in a homeless shelter. Mother kicked her out of the house because she was making threats of self harm and harming mother.  Mother states that her medications are not right and she thinks that she needs to be placed in a higher level of care. She needs to be committed. Mother states she needs in home services because she can't control her self and she would appreciate Dr. Lavella Lemons help with this. She said she would have [facility] call our office in the morning.   Informed mother that any patient can walk in to our [facility] to be assessed or go directly to the ER. She may also go to [employer] office and take out an IVC on the patient.

## 2021-02-28 ENCOUNTER — Other Ambulatory Visit: Payer: Self-pay

## 2021-02-28 ENCOUNTER — Ambulatory Visit (HOSPITAL_COMMUNITY): Payer: Medicare Other | Admitting: Licensed Clinical Social Worker

## 2021-02-28 NOTE — Telephone Encounter (Signed)
Agree with IVC if needed and information given about walk in. noted

## 2021-02-28 NOTE — Progress Notes (Signed)
Therapist contacted patient by phone for session and left a message. She did not respond so session is a no show

## 2021-03-04 ENCOUNTER — Ambulatory Visit: Payer: Medicare Other | Admitting: Nurse Practitioner

## 2021-03-11 ENCOUNTER — Ambulatory Visit: Payer: Medicare Other | Admitting: Medical-Surgical

## 2021-03-14 ENCOUNTER — Ambulatory Visit (HOSPITAL_COMMUNITY): Payer: Medicare Other | Admitting: Psychiatry

## 2021-03-25 ENCOUNTER — Ambulatory Visit (HOSPITAL_COMMUNITY): Payer: Medicare HMO | Admitting: Licensed Clinical Social Worker

## 2021-03-31 ENCOUNTER — Encounter: Payer: Self-pay | Admitting: Emergency Medicine

## 2021-03-31 ENCOUNTER — Emergency Department (INDEPENDENT_AMBULATORY_CARE_PROVIDER_SITE_OTHER)
Admission: EM | Admit: 2021-03-31 | Discharge: 2021-03-31 | Disposition: A | Discharge status: Home / Self Care | Payer: Medicare Other | Source: Home / Self Care

## 2021-03-31 ENCOUNTER — Other Ambulatory Visit: Payer: Self-pay

## 2021-03-31 DIAGNOSIS — R300 Dysuria: Secondary | ICD-10-CM | POA: Diagnosis not present

## 2021-03-31 LAB — POCT URINALYSIS DIP (MANUAL ENTRY)
Bilirubin, UA: NEGATIVE
Blood, UA: NEGATIVE
Glucose, UA: NEGATIVE mg/dL
Ketones, POC UA: NEGATIVE mg/dL
Leukocytes, UA: NEGATIVE
Nitrite, UA: NEGATIVE
Protein Ur, POC: NEGATIVE mg/dL
Spec Grav, UA: 1.01 (ref 1.010–1.025)
Urobilinogen, UA: 0.2 E.U./dL
pH, UA: 7 (ref 5.0–8.0)

## 2021-03-31 MED ORDER — PHENAZOPYRIDINE HCL 200 MG PO TABS: 200.0000 mg | ORAL_TABLET | Freq: Three times a day (TID) | ORAL | 0 refills | Status: AC

## 2021-03-31 NOTE — Discharge Instructions (Addendum)
Your urine in the office was negative for infection today.  I have sent in Pyridium for you to take 3 times a day for the next 2 days.  Hopefully her symptoms are coming from a recent change in diet which can lead to bladder irritation.  Drink plenty of noncarbonated, noncaffeinated fluids to help flush out your bladder.  Follow up with this office or with primary care if symptoms are persisting.  Follow up in the ER for high fever, trouble swallowing, trouble breathing, other concerning symptoms.

## 2021-03-31 NOTE — ED Triage Notes (Signed)
Burning w/ urination started today

## 2021-03-31 NOTE — ED Provider Notes (Signed)
[facility]    CSN: [id number] Arrival date & time: 03/31/21  1553      History   Chief Complaint Chief Complaint  Patient presents with  . Dysuria    HPI Shannon Boyd is a 35 y.o. female.   Reports that she had 1 episode of dysuria about half an hour ago.  States that she does have history of UTIs.  Has not attempted OTC treatment.  There are no aggravating or alleviating factors.  She has medical history of migraines, GERD, depression, schizoaffective disorder, obesity.  Denies abdominal pain, flank pain, chills, fever, hematuria, vaginal discharge, vaginal itching, vaginal odor.  The history is provided by the patient.    Past Medical History:  Diagnosis Date  . Anxiety   . Asthma   . Depression   . History of fracture of toe   . History of sprained ankle   . Hypertension   . Schizoaffective disorder [facility])     Patient Active Problem List   Diagnosis Date Noted  . Fracture of ankle, lateral malleolus, right, closed 01/01/2021  . Allergic rhinitis 10/31/2019  . Insomnia 10/31/2019  . Arthritis 07/06/2019  . Asthma 07/06/2019  . Hypersomnia 03/15/2015  . Morbid (severe) obesity due to excess calories (HCC) 03/15/2015  . Hypertension   . History of fracture of toe   . Depression   . Schizoaffective disorder (HCC)   . GERD (gastroesophageal reflux disease) 04/09/2012  . Low back pain 04/09/2012  . Migraine with aura and without status migrainosus, not intractable 04/09/2012    Past Surgical History:  Procedure Laterality Date  . ADENOIDECTOMY    . SALPINGECTOMY Bilateral   . WISDOM TOOTH EXTRACTION      OB History   No obstetric history on file.      Home Medications    Prior to Admission medications   Medication Sig Start Date End Date Taking? Authorizing Provider  phenazopyridine (PYRIDIUM) 200 MG tablet Take 1 tablet (200 mg total) by mouth 3 (three) times daily. 03/31/21  Yes Moshe Cipro, NP  benztropine (COGENTIN) 0.5 MG  tablet TAKE ONE TABLET BY MOUTH EVERY MORNING AND TAKE ONE TABLET BY MOUTH EVERY EVENING 02/20/21   Thresa Ross, MD  Cholecalciferol (VITAMIN D3) 50 MCG (2000 UT) capsule Take 2,000 Units by mouth daily.    [provider]  ibuprofen (ADVIL) 800 MG tablet Take 1 tablet (800 mg total) by mouth every 8 (eight) hours as needed. 01/07/21   Christen Butter, NP  melatonin 1 MG TABS tablet Take 1-2 mg by mouth at bedtime as needed.     [provider]  omeprazole (PRILOSEC) 40 MG capsule TAKE ONE CAPSULE BY MOUTH EVERY DAY 02/18/21   Christen Butter, NP  Oxcarbazepine (TRILEPTAL) 300 MG tablet Take 1 tablet (300 mg total) by mouth 2 (two) times daily. 02/13/21   Thresa Ross, MD  perphenazine (TRILAFON) 4 MG tablet Take 1 tablet (4 mg total) by mouth 3 (three) times daily. Take one 4mg  during the day and 2 at night (8mg ) 02/13/21   , MD  sodium fluoride (FLUORISHIELD) 1.1 % GEL dental gel Denta 5000 Plus 1.1 % cream    [provider]  venlafaxine XR (EFFEXOR-XR) 37.5 MG 24 hr capsule TAKE 3 CAPSULES (112.5 MG TOTAL) BY MOUTH DAILY WITH BREAKFAST. 12/04/20   Thresa Ross, MD    Family History Family History  Problem Relation Age of Onset  . Depression Mother   . Anxiety disorder Mother   .  Seizures Mother   . Breast cancer Mother   . Dementia Mother   . Cancer Mother        breast  . Schizophrenia Father   . Stroke Maternal Grandmother     Social History Social History   Tobacco Use  . Smoking status: Never Smoker  . Smokeless tobacco: Never Used  Vaping Use  . Vaping Use: Never used  Substance Use Topics  . Alcohol use: Yes    Comment: Rarely  . Drug use: No     Allergies   Aripiprazole, Codeine, Doxycycline, Latuda [lurasidone hcl], Lurasidone, Penicillins, Pseudoeph-bromphen-dm, Resperal-dm [pseudoeph-bromphen-dm], Risperidone and related, Sulfa antibiotics, Topamax [topiramate], and Decadron [dexamethasone]   Review of Systems Review of  Systems   Physical Exam Triage Vital Signs ED Triage Vitals  Enc Vitals Group     BP 03/31/21 1608 (!) 151/91     Pulse Rate 03/31/21 1608 92     Resp 03/31/21 1608 16     Temp 03/31/21 1608 99.3 F (37.4 C)     Temp Source 03/31/21 1608 Oral     SpO2 03/31/21 1608 99 %     Weight --      Height --      Head Circumference --      Peak Flow --      Pain Score 03/31/21 1610 8     Pain Loc --      Pain Edu? --      Excl. in GC? --    No data found.  Updated Vital Signs BP (!) 151/91 (BP Location: Right Arm)   Pulse 92   Temp 99.3 F (37.4 C) (Oral)   Resp 16   LMP 03/08/2021 (Approximate)   SpO2 99%   Visual Acuity Right Eye Distance:   Left Eye Distance:   Bilateral Distance:    Right Eye Near:   Left Eye Near:    Bilateral Near:     Physical Exam Vitals and nursing note reviewed.  Constitutional:      General: She is not in acute distress.    Appearance: Normal appearance. She is well-developed. She is obese. She is not ill-appearing.  HENT:     Head: Normocephalic and atraumatic.     Nose: Nose normal.     Mouth/Throat:     Mouth: Mucous membranes are moist.     Pharynx: Oropharynx is clear.  Eyes:     Extraocular Movements: Extraocular movements intact.     Conjunctiva/sclera: Conjunctivae normal.     Pupils: Pupils are equal, round, and reactive to light.  Cardiovascular:     Rate and Rhythm: Normal rate and regular rhythm.     Heart sounds: No murmur heard.   Pulmonary:     Effort: Pulmonary effort is normal. No respiratory distress.     Breath sounds: Normal breath sounds.  Abdominal:     General: Bowel sounds are normal. There is no distension.     Palpations: Abdomen is soft. There is no mass.     Tenderness: There is no abdominal tenderness. There is no right CVA tenderness, left CVA tenderness, guarding or rebound.     Hernia: No hernia is present.  Musculoskeletal:        General: Normal range of motion.     Cervical back: Normal range  of motion and neck supple.  Skin:    General: Skin is warm and dry.     Capillary Refill: Capillary refill takes less than 2 seconds.  Neurological:     General: No focal deficit present.     Mental Status: She is alert and oriented to person, place, and time.  Psychiatric:        Mood and Affect: Mood normal.        Thought Content: Thought content normal.      UC Treatments / Results  Labs (all labs ordered are listed, but only abnormal results are displayed) Labs Reviewed  POCT URINALYSIS DIP (MANUAL ENTRY) - Abnormal; Notable for the following components:      Result Value   Color, UA light yellow (*)    All other components within normal limits    EKG   Radiology No results found.  Procedures Procedures (including critical care time)  Medications Ordered in UC Medications - No data to display  Initial Impression / Assessment and Plan / UC Course  I have reviewed the triage vital signs and the nursing notes.  Pertinent labs & imaging results that were available during my care of the patient were reviewed by me and considered in my medical decision making (see chart for details).    Dysuria  UA not concerning for infection in the office today May push fluids Discussed that symptoms could be coming from an irritated bladder versus UTI Prescribed Pyridium to take 3 times daily for the next 2 days If symptoms are not improving, follow-up with this office or with primary care Follow-up with the ER for high fever, trouble swallowing, trouble breathing, other concerning symptoms   Final Clinical Impressions(s) / UC Diagnoses   Final diagnoses:  Dysuria     Discharge Instructions     Your urine in the office was negative for infection today.  I have sent in Pyridium for you to take 3 times a day for the next 2 days.  Hopefully her symptoms are coming from a recent change in diet which can lead to bladder irritation.  Drink plenty of noncarbonated,  noncaffeinated fluids to help flush out your bladder.  Follow up with this office or with primary care if symptoms are persisting.  Follow up in the ER for high fever, trouble swallowing, trouble breathing, other concerning symptoms.     ED Prescriptions    Medication Sig Dispense Auth. Provider   phenazopyridine (PYRIDIUM) 200 MG tablet Take 1 tablet (200 mg total) by mouth 3 (three) times daily. 6 tablet Moshe Cipro, NP     PDMP not reviewed this encounter.   Moshe Cipro, NP 04/01/21 [phone]

## 2021-04-05 ENCOUNTER — Telehealth (INDEPENDENT_AMBULATORY_CARE_PROVIDER_SITE_OTHER): Payer: Medicare Other | Admitting: Medical-Surgical

## 2021-04-05 ENCOUNTER — Encounter: Payer: Self-pay | Admitting: Medical-Surgical

## 2021-04-05 DIAGNOSIS — F329 Major depressive disorder, single episode, unspecified: Secondary | ICD-10-CM | POA: Diagnosis not present

## 2021-04-05 DIAGNOSIS — F251 Schizoaffective disorder, depressive type: Secondary | ICD-10-CM | POA: Diagnosis not present

## 2021-04-05 DIAGNOSIS — K5900 Constipation, unspecified: Secondary | ICD-10-CM | POA: Diagnosis not present

## 2021-04-05 DIAGNOSIS — K219 Gastro-esophageal reflux disease without esophagitis: Secondary | ICD-10-CM

## 2021-04-05 MED ORDER — OMEPRAZOLE 40 MG PO CPDR: 1.0000 | DELAYED_RELEASE_CAPSULE | Freq: Every day | ORAL | 1 refills | Status: AC

## 2021-04-05 NOTE — Progress Notes (Signed)
Virtual Visit via Video Note  I connected with Shannon Boyd on 04/05/21 at 10:30 AM EDT by a video enabled telemedicine application and verified that I am speaking with the correct person using two identifiers.   I discussed the limitations of evaluation and management by telemedicine and the availability of in person appointments. The patient expressed understanding and agreed to proceed.  Patient location: home Provider locations: office  Subjective:    CC: depression, GERD, constipation  HPI: Pleasant 35 year old female presenting via MyChart video visit for the following:  Psychiatric concerns-has had a lots of stress going on regarding her mom and her current living arrangements.  She did report to the ED for suicidal ideation back in March and was admitted inpatient for a while.  They started her on Prozac 20 mg daily and Haldol 5 mg daily which she reports are helping with her mood tremendously.  She is following psychiatry with [facility] but is probably going to switch to [facility] since she can be seen virtually.  She is continuing to have issues with living arrangements and she is trying to find a place to move to.  Worried that her mom is going to evict her.  Denies current SI/HI.  GERD-taking omeprazole 40 mg daily.  She had plan around with the idea of switching up her meds but has decided to stay on this for now since she has so much going on in her life otherwise.  Constipation-she does intermittently have trouble with constipation and notes that during her hospitalization, she did have a flare.  They administered milk of magnesia daily as needed in the hospital which was helpful.  Thinks that them feeding her beef was part of the exacerbating factor.  She is not currently taking anything for constipation but is aware of over-the-counter options.  Past medical history, Surgical history, Family history not pertinant except as noted below, Social history, Allergies, and medications  have been entered into the medical record, reviewed, and corrections made.   Review of Systems: See HPI for pertinent positives and negatives.   Objective:    General: Speaking clearly in complete sentences without any shortness of breath.  Alert and oriented x3.  Normal judgment. No apparent acute distress.  Impression and Recommendations:    1. Gastroesophageal reflux disease without esophagitis Continue omeprazole 40 mg daily.  2. Constipation, unspecified constipation type Since this is intermittent, no new prescriptions today.  Recommend use of over-the-counter stool softeners or fiber supplements.  Increase water intake.  3. Major depressive disorder, remission status unspecified, unspecified whether recurrent 4. Schizoaffective disorder, depressive type (HCC) Continue current medications.  This is going to be managed by psychiatry.  I discussed the assessment and treatment plan with the patient. The patient was provided an opportunity to ask questions and all were answered. The patient agreed with the plan and demonstrated an understanding of the instructions.   The patient was advised to call back or seek an in-person evaluation if the symptoms worsen or if the condition fails to improve as anticipated.  20 minutes of non-face-to-face time was provided during this encounter.  Return in about 3 months (around 07/06/2021) for GERD/constipation follow-up.  Thayer Ohm, DNP, APRN, FNP-BC [facility]

## 2021-04-09 ENCOUNTER — Telehealth (HOSPITAL_COMMUNITY): Payer: Medicare Other | Admitting: Psychiatry

## 2021-04-29 ENCOUNTER — Telehealth: Payer: Self-pay

## 2021-04-29 NOTE — Telephone Encounter (Signed)
Transition Care Management Unsuccessful Follow-up Telephone Call  Date of discharge and from where: 04/27/2021 from [facility]  Attempts:  1st Attempt  Reason for unsuccessful TCM follow-up call:  Left voice message

## 2021-04-30 NOTE — Telephone Encounter (Signed)
Transition Care Management Unsuccessful Follow-up Telephone Call  Date of discharge and from where:  04/27/2021 from [facility]  Attempts:  2nd Attempt  Reason for unsuccessful TCM follow-up call:  Left voice message

## 2021-05-01 NOTE — Telephone Encounter (Signed)
Transition Care Management Unsuccessful Follow-up Telephone Call  Date of discharge and from where:  04/27/21 from [facility]  Attempts:  3rd Attempt  Reason for unsuccessful TCM follow-up call:  Unable to reach patient

## 2021-05-27 ENCOUNTER — Telehealth: Payer: Self-pay | Admitting: General Practice

## 2021-05-27 NOTE — Telephone Encounter (Signed)
Transition Care Management Unsuccessful Follow-up Telephone Call  Date of discharge and from where:  05/26/21 from [facility]  Attempts:  1st Attempt  Reason for unsuccessful TCM follow-up call:  Unable to leave message

## 2021-05-28 NOTE — Telephone Encounter (Signed)
Transition Care Management Unsuccessful Follow-up Telephone Call  Date of discharge and from where:  05/26/21 from [facility]   Attempts:  2nd Attempt  Reason for unsuccessful TCM follow-up call:  Unable to leave message

## 2021-05-29 NOTE — Telephone Encounter (Signed)
Transition Care Management Unsuccessful Follow-up Telephone Call  Date of discharge and from where:  05/26/21 from [facility]  Attempts:  3rd Attempt  Reason for unsuccessful TCM follow-up call:  Unable to leave message

## 2022-02-09 IMAGING — DX DG ANKLE COMPLETE 3+V*R*
3 series · 3 of 3 positions shown · non-contrast
Comparison: None.

CLINICAL DATA: Ankle pain

EXAM:
RIGHT ANKLE - COMPLETE 3+ VIEW

[ankle ap]
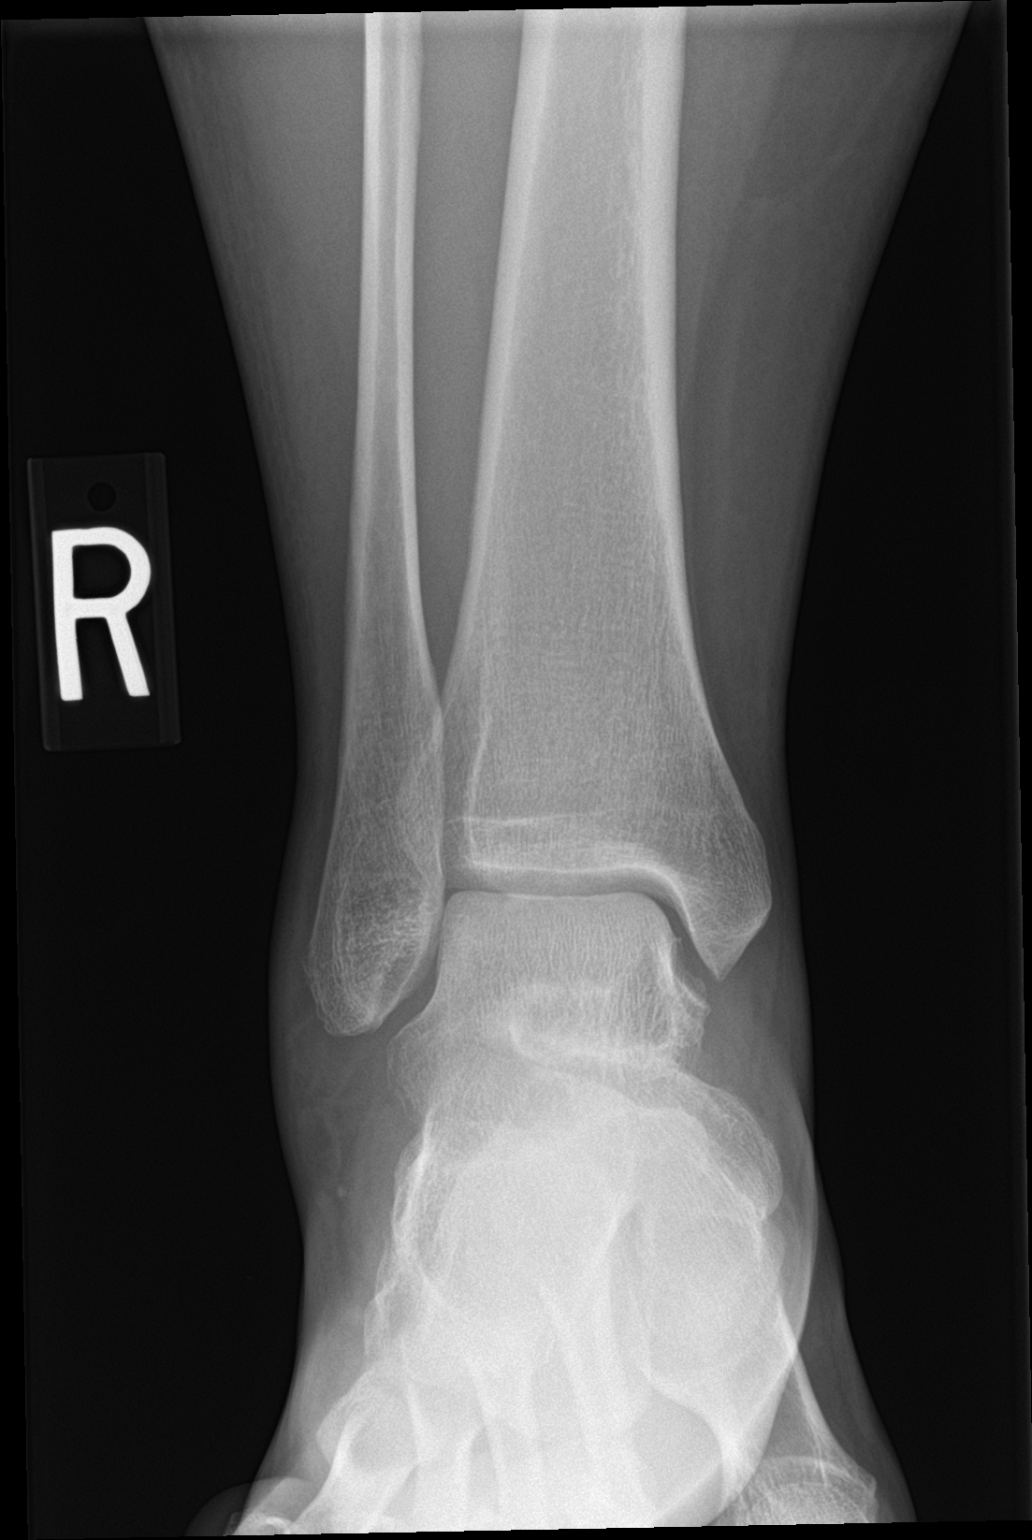

[ankle obl]
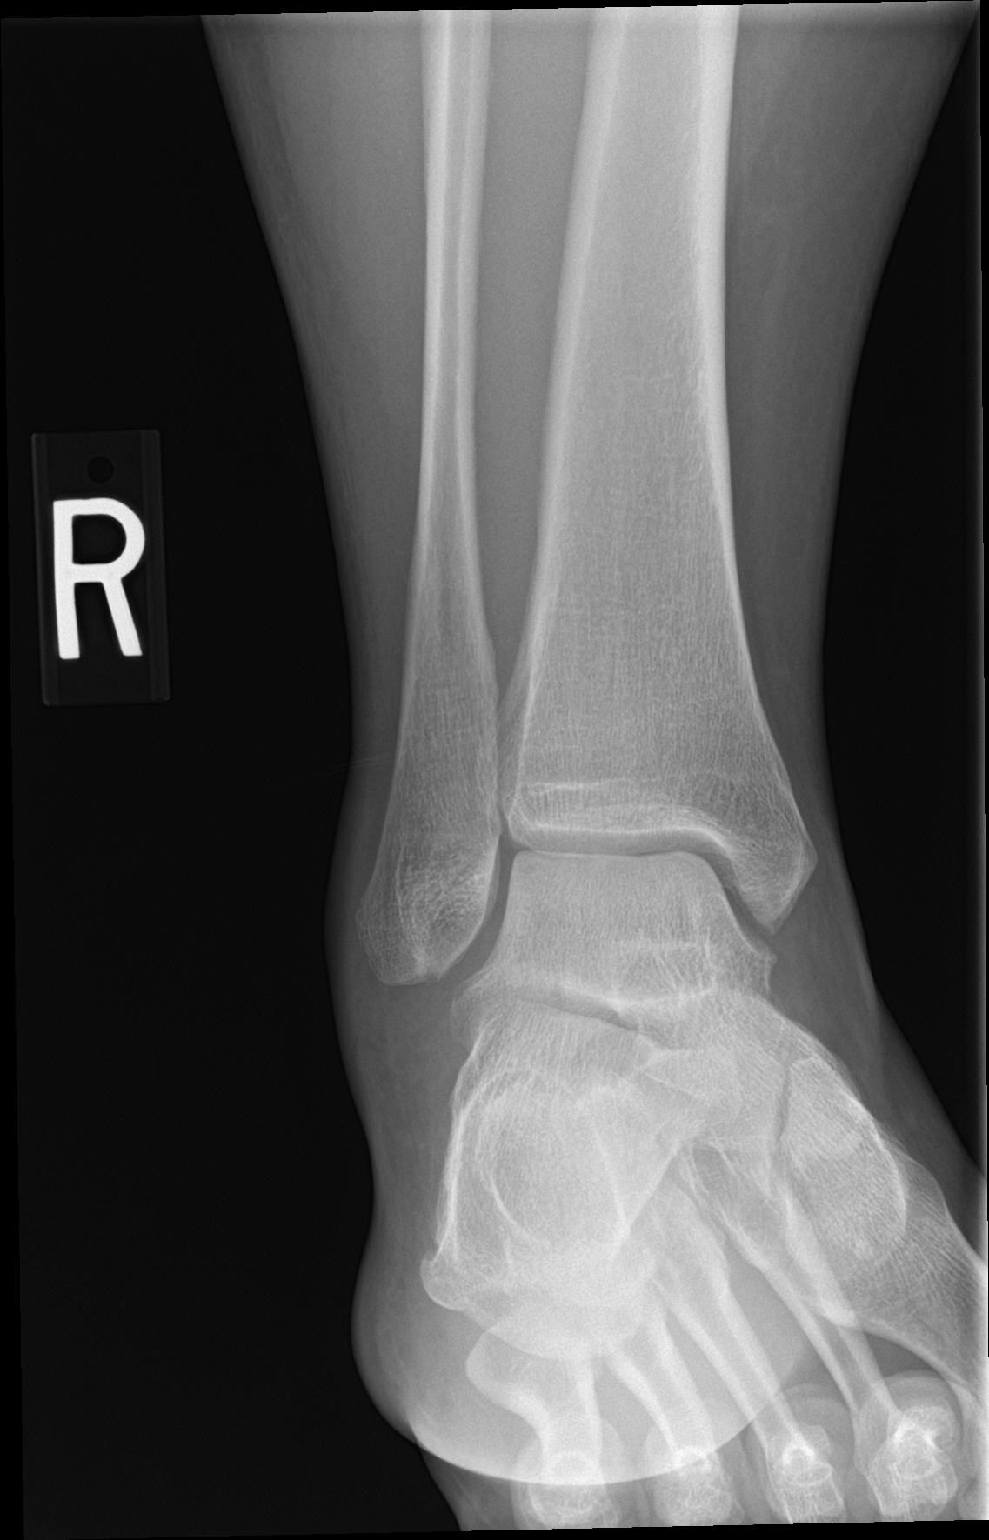

[ankle lat]
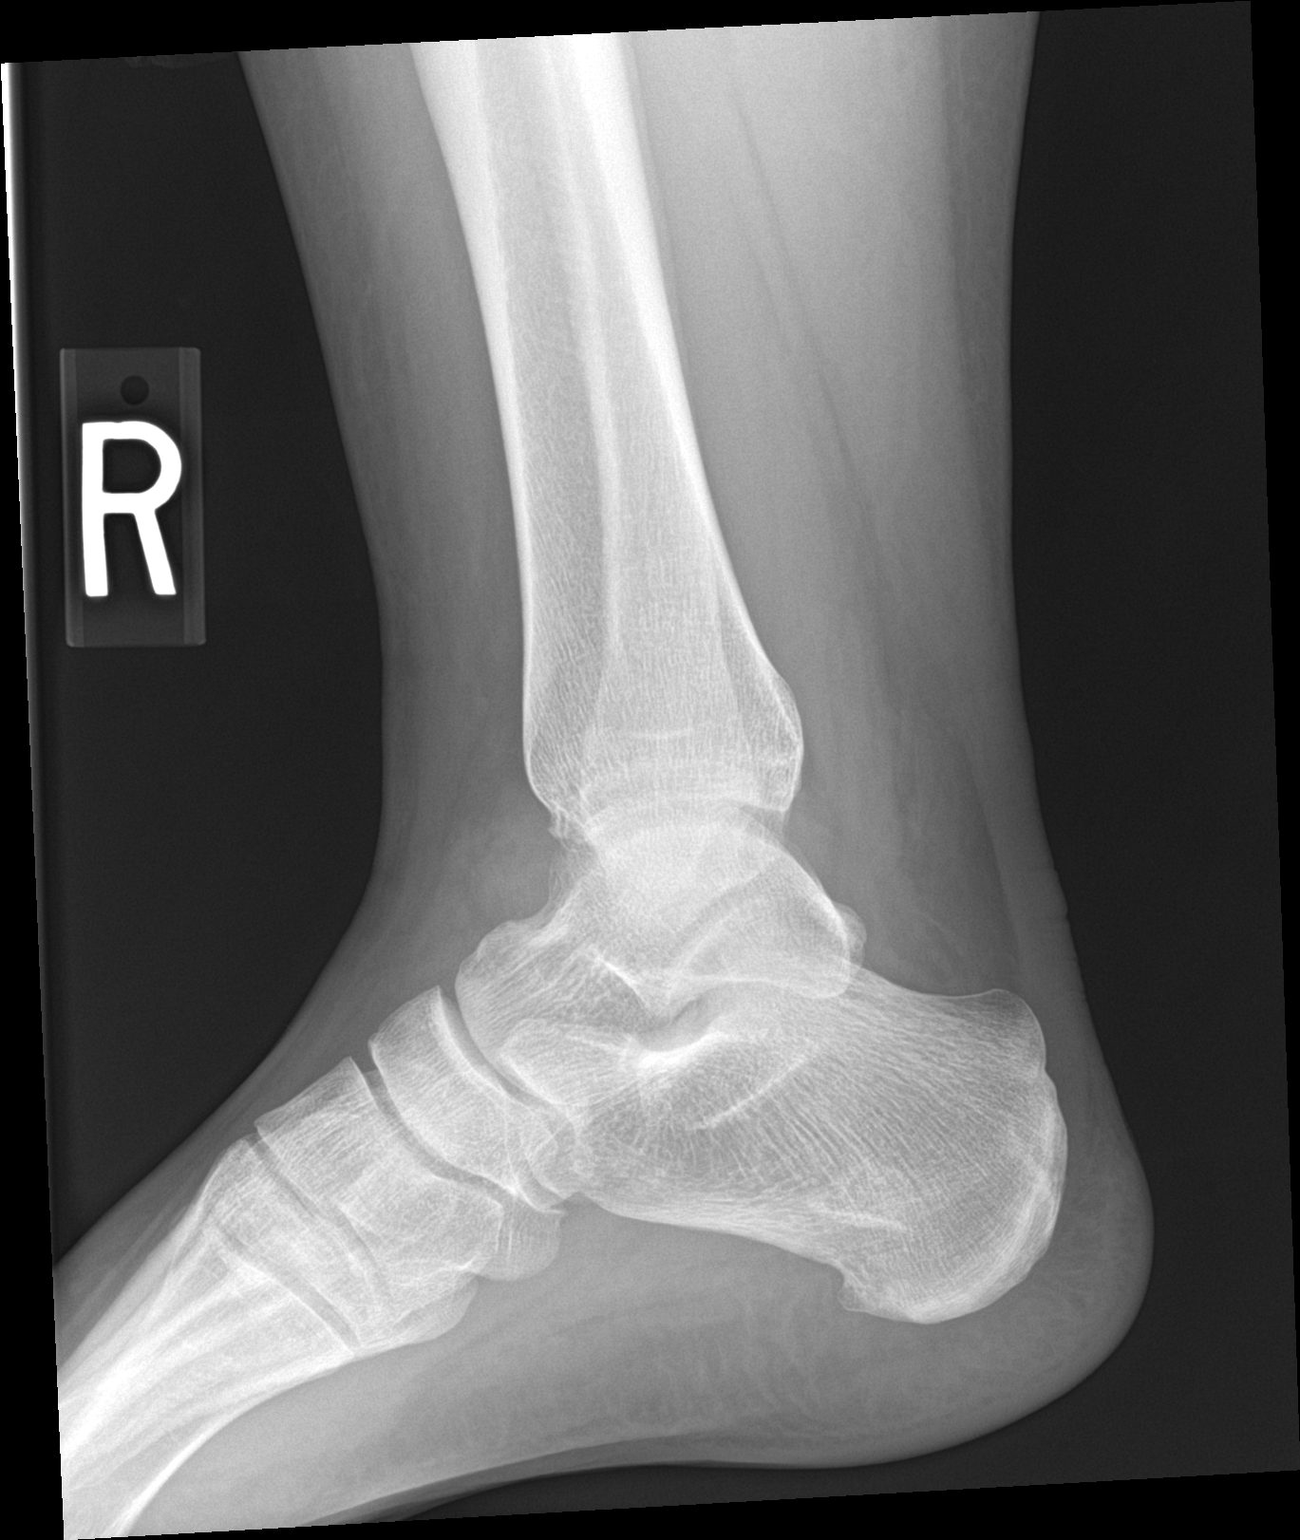

[3 of 3 positions shown; findings below may reference images not displayed]

FINDINGS: There is mild soft tissue swelling about the lateral malleolus.
There is a subtle lucency coursing through the lateral malleolus
which raises suspicion for a nondisplaced fracture. Six the ankle
mortise is intact. There is an ankle joint effusion.
IMPRESSION: 1. Findings suspicious for a nondisplaced fracture through the
lateral malleolus.
2. Mild soft tissue swelling about the lateral malleolus.
3. Ankle joint effusion.

## 2022-02-24 IMAGING — DX DG ANKLE COMPLETE 3+V*R*
3 series · 3 of 3 positions shown · non-contrast
Comparison: 01/01/2021

CLINICAL DATA: Follow-up of lateral malleolus fracture.

EXAM:
RIGHT ANKLE - COMPLETE 3+ VIEW

[ankle ap]
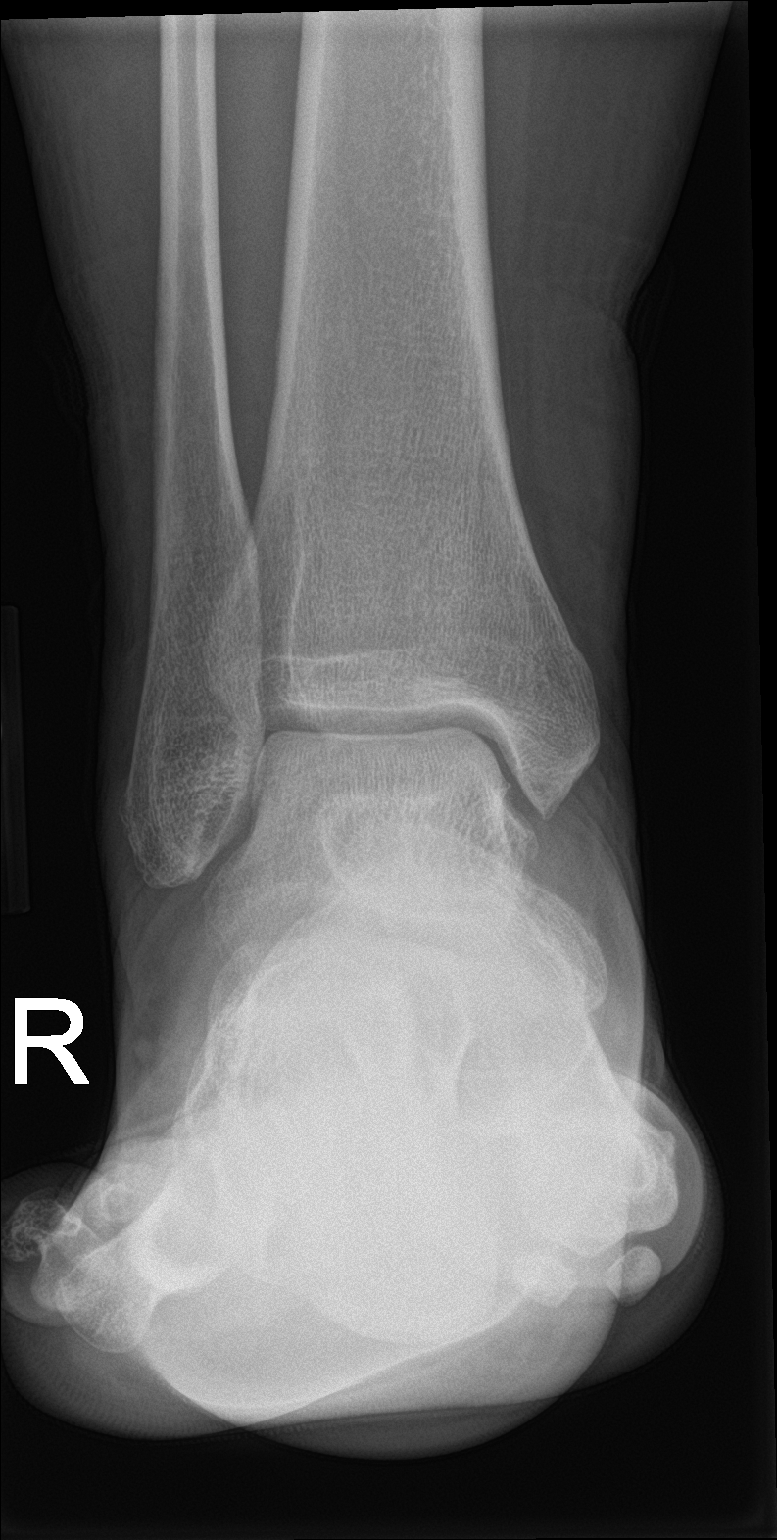

[ankle obl]
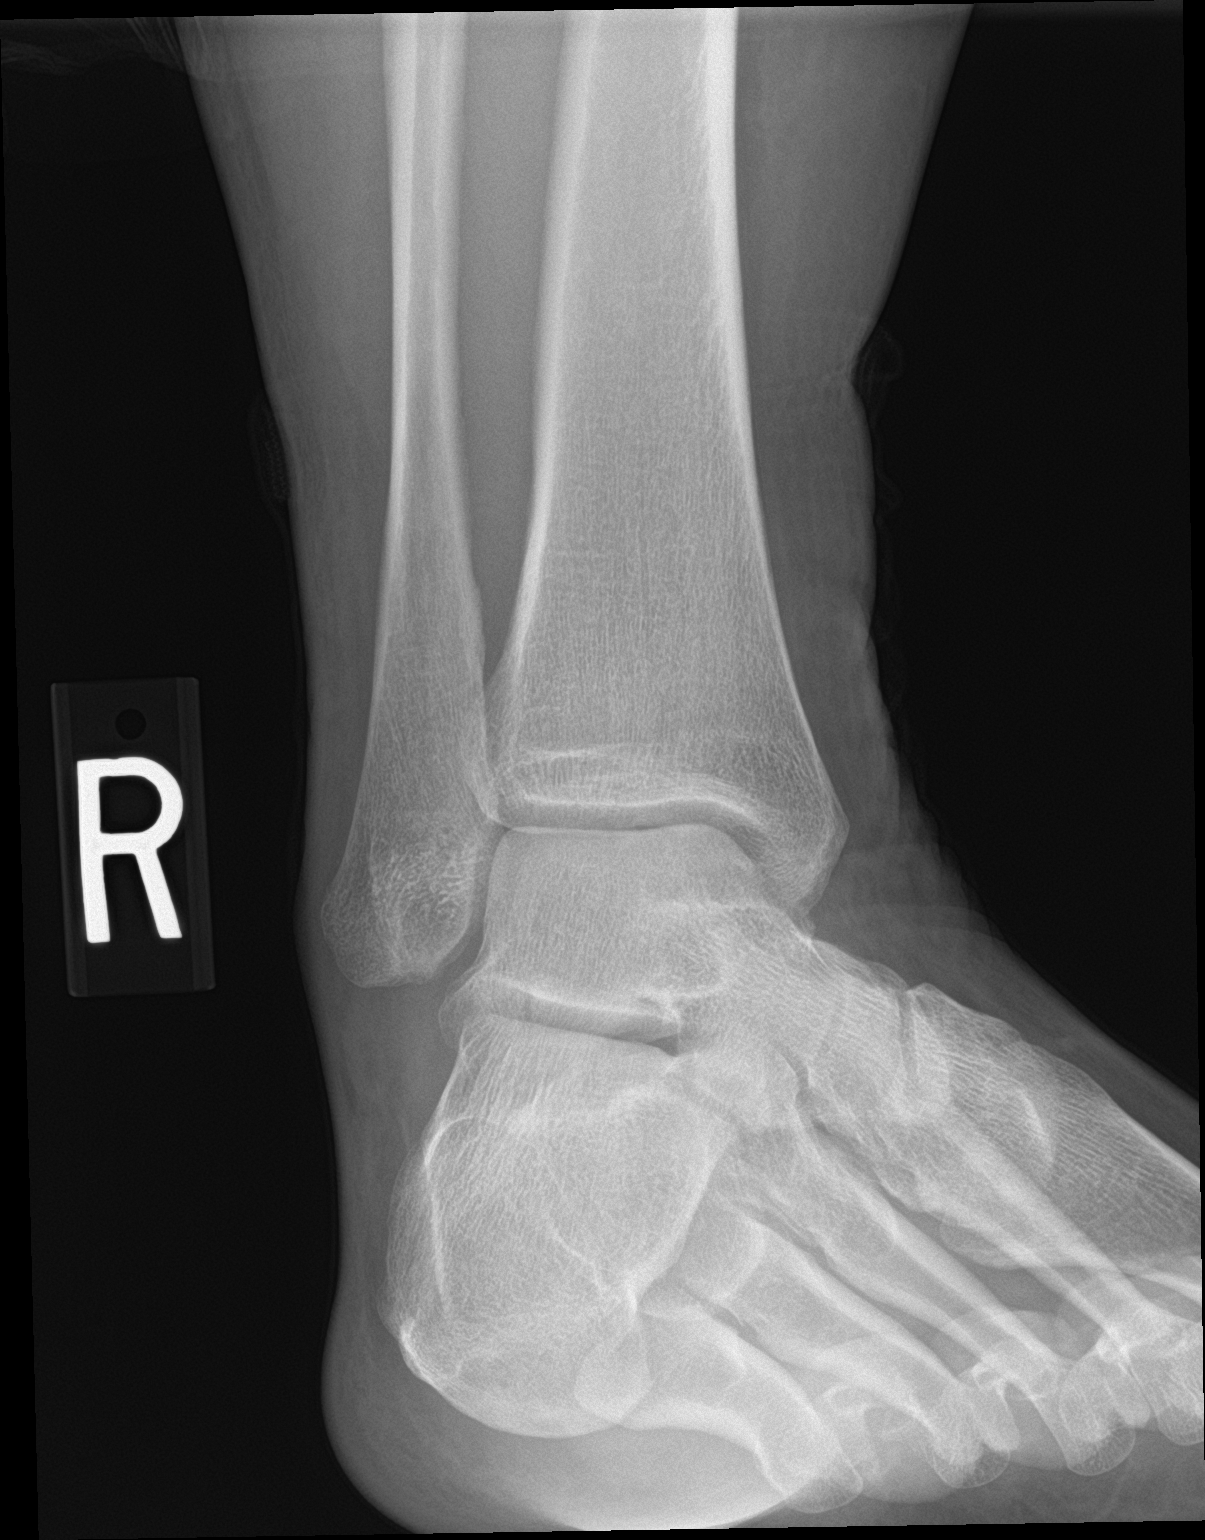

[ankle lat]
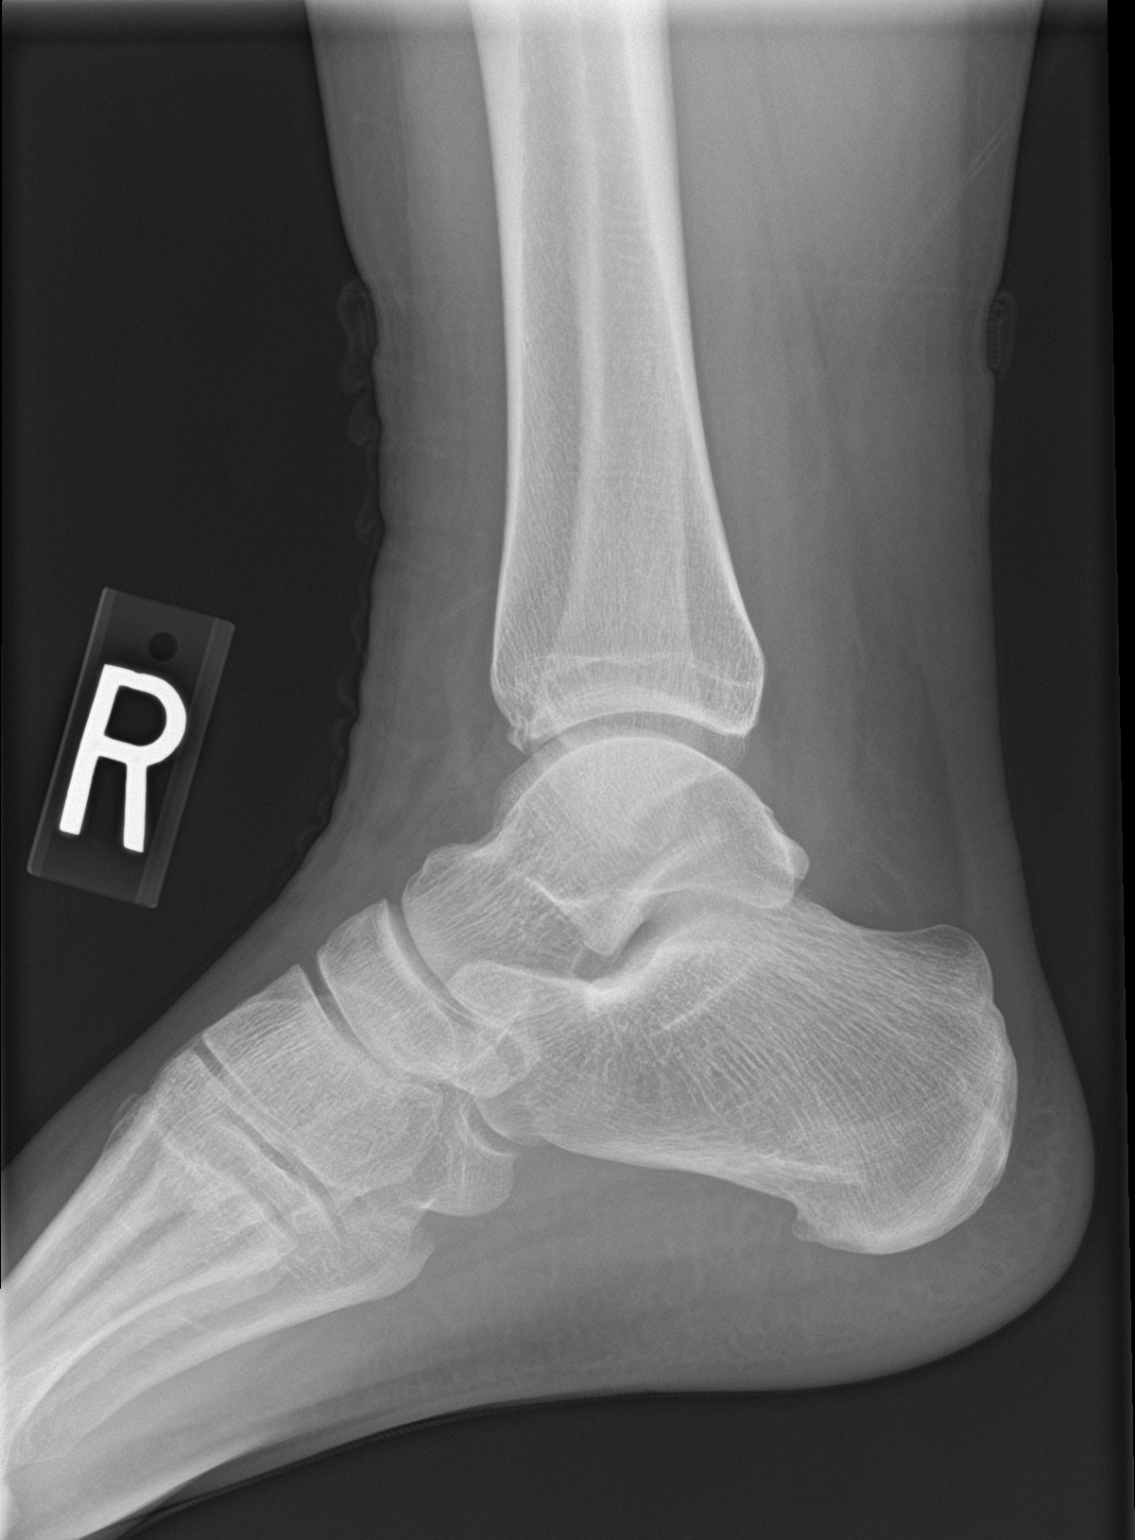

[3 of 3 positions shown; findings below may reference images not displayed]

FINDINGS: The previously demonstrated possible lateral malleolus fracture is
not well visualized on today's study. There is minimal surrounding
soft tissue swelling. There is no definite new acute displaced
fracture or dislocation. An ankle joint effusion is noted.
IMPRESSION: 1. No definite acute displaced fracture or dislocation. The
previously demonstrated finding is no longer well visualized.
2. No significant soft tissue swelling.

## 2022-07-23 ENCOUNTER — Encounter: Payer: Self-pay | Admitting: General Practice
